# Patient Record
Sex: Female | Born: 1995 | Race: White | Hispanic: No | Marital: Married | Smoking: Current every day smoker
Health system: Southern US, Community
[De-identification: ages and names within clinical notes are randomized; demographics above are authoritative.]

## PROBLEM LIST (undated history)

## (undated) ENCOUNTER — Inpatient Hospital Stay: Payer: Self-pay

## (undated) DIAGNOSIS — N289 Disorder of kidney and ureter, unspecified: Secondary | ICD-10-CM

## (undated) DIAGNOSIS — F32A Depression, unspecified: Secondary | ICD-10-CM

## (undated) DIAGNOSIS — Z720 Tobacco use: Secondary | ICD-10-CM

## (undated) DIAGNOSIS — F419 Anxiety disorder, unspecified: Secondary | ICD-10-CM

## (undated) DIAGNOSIS — N390 Urinary tract infection, site not specified: Secondary | ICD-10-CM

## (undated) DIAGNOSIS — B009 Herpesviral infection, unspecified: Secondary | ICD-10-CM

## (undated) DIAGNOSIS — D649 Anemia, unspecified: Secondary | ICD-10-CM

## (undated) DIAGNOSIS — K589 Irritable bowel syndrome without diarrhea: Secondary | ICD-10-CM

## (undated) DIAGNOSIS — T7421XA Adult sexual abuse, confirmed, initial encounter: Secondary | ICD-10-CM

## (undated) HISTORY — DX: Urinary tract infection, site not specified: N39.0

## (undated) HISTORY — DX: Anemia, unspecified: D64.9

## (undated) HISTORY — DX: Herpesviral infection, unspecified: B00.9

## (undated) HISTORY — PX: WISDOM TOOTH EXTRACTION: SHX21

## (undated) HISTORY — DX: Tobacco use: Z72.0

## (undated) HISTORY — DX: Adult sexual abuse, confirmed, initial encounter: T74.21XA

## (undated) HISTORY — PX: NO PAST SURGERIES: SHX2092

---
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
## 2013-04-21 ENCOUNTER — Emergency Department: Payer: Self-pay | Admitting: Emergency Medicine

## 2013-05-31 ENCOUNTER — Observation Stay: Payer: Self-pay

## 2013-05-31 LAB — URINALYSIS, COMPLETE
Bacteria: NONE SEEN
Bilirubin,UR: NEGATIVE
Glucose,UR: NEGATIVE mg/dL (ref 0–75)
Ketone: NEGATIVE
Nitrite: NEGATIVE
Ph: 5 (ref 4.5–8.0)
Protein: 30
RBC,UR: 7 /HPF (ref 0–5)
Specific Gravity: 1.028 (ref 1.003–1.030)
Squamous Epithelial: 10
WBC UR: 478 /HPF (ref 0–5)

## 2013-05-31 LAB — HCG, QUANTITATIVE, PREGNANCY: Beta Hcg, Quant.: 59013 m[IU]/mL — ABNORMAL HIGH

## 2013-12-11 ENCOUNTER — Inpatient Hospital Stay: Payer: Self-pay | Admitting: Obstetrics and Gynecology

## 2013-12-11 LAB — CBC WITH DIFFERENTIAL/PLATELET
Basophil #: 0 10*3/uL (ref 0.0–0.1)
Basophil %: 0.1 %
Eosinophil #: 0.2 10*3/uL (ref 0.0–0.7)
Eosinophil %: 1.3 %
HCT: 36.1 % (ref 35.0–47.0)
HGB: 12.2 g/dL (ref 12.0–16.0)
Lymphocyte #: 1.8 10*3/uL (ref 1.0–3.6)
Lymphocyte %: 14.8 %
MCH: 31.7 pg (ref 26.0–34.0)
MCHC: 33.8 g/dL (ref 32.0–36.0)
MCV: 94 fL (ref 80–100)
Monocyte #: 0.7 x10 3/mm (ref 0.2–0.9)
Monocyte %: 6.3 %
Neutrophil #: 9.2 10*3/uL — ABNORMAL HIGH (ref 1.4–6.5)
Neutrophil %: 77.5 %
Platelet: 157 10*3/uL (ref 150–440)
RBC: 3.84 10*6/uL (ref 3.80–5.20)
RDW: 13.4 % (ref 11.5–14.5)
WBC: 11.9 10*3/uL — ABNORMAL HIGH (ref 3.6–11.0)

## 2013-12-11 LAB — GC/CHLAMYDIA PROBE AMP

## 2013-12-13 LAB — HEMATOCRIT: HCT: 32.4 % — ABNORMAL LOW (ref 35.0–47.0)

## 2014-09-04 NOTE — L&D Delivery Note (Signed)
Delivery Summary for Nicole Kramer  Labor Events:   Preterm labor:   Rupture date:   Rupture time:   Rupture type: Artificial  Fluid Color: Clear  Induction:   Augmentation:   Complications:   Cervical ripening:          Delivery: SVD; Loose nuchal cord reduced  Episiotomy: None  Lacerations: None  Repair suture:   Repair # of packets:   Blood loss (ml): 500 mL   Information for the patient's newbornBaruch Merl:  Kramer, PendingBaby [[id number]][[id number]]    Delivery 06/24/2015 2:09 PM by  Vaginal, Spontaneous Delivery Sex:  unspecified sex Gestational Age: 2137w5d Delivery Clinician:  Daphine DeutscherMartin A Angie Hogg Living?: Yes        APGARS  One minute Five minutes Ten minutes  Skin color: 1   1      Heart rate: 2   2      Grimace: 2   2      Muscle tone: 2   2      Breathing: 2   2      Totals: 9  9      Presentation/position: Vertex  Right Occiput Anterior Resuscitation:   Cord information: 3 vessels   Disposition of cord blood: No    Blood gases sent? No Complications: None  Placenta: Delivered: 06/24/2015 2:17 PM  Spontaneous   appearance Newborn Measurements: Weight:    Height:    Head circumference:    Chest circumference:    Other providers: Delivery Nurse Transition RN Tammy F Margo AyeHall Tiffany D [place]  Additional  Information:Loose nuchal cord, reduced Forceps:   Vacuum:   Breech:   Observed anomalies

## 2014-10-30 ENCOUNTER — Emergency Department: Payer: Self-pay | Admitting: Student

## 2014-12-20 ENCOUNTER — Emergency Department
Admit: 2014-12-20 | Disposition: A | Discharge status: Home / Self Care | Payer: Self-pay | Admitting: Emergency Medicine

## 2014-12-20 LAB — URINALYSIS, COMPLETE
Bilirubin,UR: NEGATIVE
Blood: NEGATIVE
Glucose,UR: NEGATIVE mg/dL (ref 0–75)
Ketone: NEGATIVE
Leukocyte Esterase: NEGATIVE
Nitrite: NEGATIVE
Ph: 7 (ref 4.5–8.0)
Protein: NEGATIVE
Specific Gravity: 1.005 (ref 1.003–1.030)

## 2014-12-20 LAB — COMPREHENSIVE METABOLIC PANEL
Albumin: 4.4 g/dL
Alkaline Phosphatase: 45 U/L
Anion Gap: 8 (ref 7–16)
BUN: 6 mg/dL
Bilirubin,Total: 0.7 mg/dL
Calcium, Total: 9.1 mg/dL
Chloride: 105 mmol/L
Co2: 25 mmol/L
Creatinine: 0.44 mg/dL
EGFR (African American): >60
EGFR (Non-African Amer.): >60
Glucose: 82 mg/dL
Potassium: 3.4 mmol/L — ABNORMAL LOW
SGOT(AST): 23 U/L
SGPT (ALT): 16 U/L
Sodium: 138 mmol/L
Total Protein: 7.4 g/dL

## 2014-12-20 LAB — CBC WITH DIFFERENTIAL/PLATELET
Basophil #: 0 10*3/uL (ref 0.0–0.1)
Basophil %: 0.3 %
Eosinophil #: 0.2 10*3/uL (ref 0.0–0.7)
Eosinophil %: 3.2 %
HCT: 37.1 % (ref 35.0–47.0)
HGB: 12.5 g/dL (ref 12.0–16.0)
Lymphocyte #: 1.5 10*3/uL (ref 1.0–3.6)
Lymphocyte %: 20 %
MCH: 31.5 pg (ref 26.0–34.0)
MCHC: 33.8 g/dL (ref 32.0–36.0)
MCV: 93 fL (ref 80–100)
Monocyte #: 0.5 x10 3/mm (ref 0.2–0.9)
Monocyte %: 6.6 %
Neutrophil #: 5.1 10*3/uL (ref 1.4–6.5)
Neutrophil %: 69.9 %
Platelet: 181 10*3/uL (ref 150–440)
RBC: 3.98 10*6/uL (ref 3.80–5.20)
RDW: 13.4 % (ref 11.5–14.5)
WBC: 7.3 10*3/uL (ref 3.6–11.0)

## 2014-12-20 LAB — HCG, QUANTITATIVE, PREGNANCY: Beta Hcg, Quant.: 65040 m[IU]/mL — ABNORMAL HIGH

## 2015-01-12 NOTE — H&P (Signed)
L&D Evaluation:  History:   HPI 16 yowf G1P0 at 39.6 weeks, estimated date of confinement 05/30/2012, admitted for IOL with Cytotec/Pitocin.    Patient's Medical History Recurrent UTI    Patient's Surgical History none    Medications Pre Natal Vitamins    Allergies NKDA    Social History none    Family History Non-Contributory   ROS:   ROS All systems were reviewed.  HEENT, CNS, GI, GU, Respiratory, CV, Renal and Musculoskeletal systems were found to be normal.   Exam:   Vital Signs stable    General no apparent distress    Mental Status clear    Heart normal sinus rhythm    Abdomen gravid, non-tender, EFW 8#4    Estimated Fetal Weight Average for gestational age    Fundal Height 38    Back no CVAT    Edema 1+    Pelvic no external lesions, 4/90/-2/AROM - Bloody, IUPC placed    Mebranes Ruptured, Bloody    FHT normal rate with no decels    Ucx regular    Skin dry    Lymph no lymphadenopathy    Other A+/ATB-/NR/RI/VI/HB-/HIV-/GBS-   Impression:   Impression TIUP for IOL   Plan:   Plan spontaneous vaginal delivery; Epidural prn   Electronic Signatures: Mackynzie Woolford, Prentice DockerMartin A (MD)  (Signed 26-Sep-13 08:43)  Authored: L&D Evaluation   Last Updated: 26-Sep-13 08:43 by Aimy Sweeting, Prentice DockerMartin A (MD)
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
## 2015-02-11 ENCOUNTER — Encounter: Payer: Self-pay | Admitting: Obstetrics and Gynecology

## 2015-02-11 ENCOUNTER — Ambulatory Visit (INDEPENDENT_AMBULATORY_CARE_PROVIDER_SITE_OTHER): Payer: Medicaid Other | Admitting: Obstetrics and Gynecology

## 2015-02-11 VITALS — BP 94/61 | HR 103 | Ht 67.0 in | Wt 169.5 lb

## 2015-02-11 DIAGNOSIS — B3731 Acute candidiasis of vulva and vagina: Secondary | ICD-10-CM

## 2015-02-11 DIAGNOSIS — Z36 Encounter for antenatal screening of mother: Secondary | ICD-10-CM

## 2015-02-11 DIAGNOSIS — Z62810 Personal history of physical and sexual abuse in childhood: Secondary | ICD-10-CM

## 2015-02-11 DIAGNOSIS — Z3492 Encounter for supervision of normal pregnancy, unspecified, second trimester: Secondary | ICD-10-CM

## 2015-02-11 DIAGNOSIS — Z87440 Personal history of urinary (tract) infections: Secondary | ICD-10-CM

## 2015-02-11 DIAGNOSIS — Z331 Pregnant state, incidental: Secondary | ICD-10-CM | POA: Diagnosis not present

## 2015-02-11 DIAGNOSIS — Z720 Tobacco use: Secondary | ICD-10-CM

## 2015-02-11 DIAGNOSIS — IMO0002 Reserved for concepts with insufficient information to code with codable children: Secondary | ICD-10-CM

## 2015-02-11 DIAGNOSIS — Z3687 Encounter for antenatal screening for uncertain dates: Secondary | ICD-10-CM

## 2015-02-11 DIAGNOSIS — B373 Candidiasis of vulva and vagina: Secondary | ICD-10-CM

## 2015-02-11 MED ORDER — TERCONAZOLE 0.4 % VA CREA: 1.0000 | TOPICAL_CREAM | Freq: Every day | VAGINAL | Status: DC

## 2015-02-12 ENCOUNTER — Telehealth: Payer: Self-pay

## 2015-02-12 ENCOUNTER — Encounter: Payer: Self-pay | Admitting: Obstetrics and Gynecology

## 2015-02-12 DIAGNOSIS — N390 Urinary tract infection, site not specified: Secondary | ICD-10-CM

## 2015-02-12 DIAGNOSIS — Z87440 Personal history of urinary (tract) infections: Secondary | ICD-10-CM | POA: Insufficient documentation

## 2015-02-12 DIAGNOSIS — B373 Candidiasis of vulva and vagina: Secondary | ICD-10-CM | POA: Insufficient documentation

## 2015-02-12 DIAGNOSIS — B3731 Acute candidiasis of vulva and vagina: Secondary | ICD-10-CM | POA: Insufficient documentation

## 2015-02-12 DIAGNOSIS — F17200 Nicotine dependence, unspecified, uncomplicated: Secondary | ICD-10-CM | POA: Insufficient documentation

## 2015-02-12 DIAGNOSIS — IMO0002 Reserved for concepts with insufficient information to code with codable children: Secondary | ICD-10-CM | POA: Insufficient documentation

## 2015-02-12 LAB — ANTIBODY SCREEN: Antibody Screen: NEGATIVE

## 2015-02-12 LAB — CBC WITH DIFFERENTIAL/PLATELET
Basophils Absolute: 0 10*3/uL (ref 0.0–0.2)
Basos: 0 %
EOS (ABSOLUTE): 0.3 10*3/uL (ref 0.0–0.4)
Eos: 2 %
Hematocrit: 33.3 % — ABNORMAL LOW (ref 34.0–46.6)
Hemoglobin: 11.1 g/dL (ref 11.1–15.9)
Immature Grans (Abs): 0 10*3/uL (ref 0.0–0.1)
Immature Granulocytes: 0 %
Lymphocytes Absolute: 1.8 10*3/uL (ref 0.7–3.1)
Lymphs: 16 %
MCH: 31.9 pg (ref 26.6–33.0)
MCHC: 33.3 g/dL (ref 31.5–35.7)
MCV: 96 fL (ref 79–97)
Monocytes Absolute: 0.5 10*3/uL (ref 0.1–0.9)
Monocytes: 4 %
Neutrophils Absolute: 8.7 10*3/uL — ABNORMAL HIGH (ref 1.4–7.0)
Neutrophils: 78 %
Platelets: 214 10*3/uL (ref 150–379)
RBC: 3.48 x10E6/uL — ABNORMAL LOW (ref 3.77–5.28)
RDW: 13.8 % (ref 12.3–15.4)
WBC: 11.3 10*3/uL — ABNORMAL HIGH (ref 3.4–10.8)

## 2015-02-12 LAB — MICROSCOPIC EXAMINATION
Casts: NONE SEEN /lpf
WBC, UA: >30 /hpf — AB (ref 0–?)

## 2015-02-12 LAB — URINALYSIS, ROUTINE W REFLEX MICROSCOPIC
Bilirubin, UA: NEGATIVE
Glucose, UA: NEGATIVE
Ketones, UA: NEGATIVE
Nitrite, UA: POSITIVE — AB
Specific Gravity, UA: 1.019 (ref 1.005–1.030)
Urobilinogen, Ur: 0.2 mg/dL (ref 0.2–1.0)
pH, UA: 6.5 (ref 5.0–7.5)

## 2015-02-12 LAB — HIV ANTIBODY (ROUTINE TESTING W REFLEX): HIV Screen 4th Generation wRfx: NONREACTIVE

## 2015-02-12 LAB — ABO AND RH: Rh Factor: POSITIVE

## 2015-02-12 LAB — RPR: RPR Ser Ql: NONREACTIVE

## 2015-02-12 LAB — VARICELLA ZOSTER ANTIBODY, IGG: Varicella zoster IgG: 173 index (ref >165)

## 2015-02-12 LAB — RUBELLA SCREEN: Rubella Antibodies, IGG: <0.9 index — ABNORMAL LOW (ref >0.99)

## 2015-02-12 LAB — HEPATITIS B SURFACE ANTIGEN: Hepatitis B Surface Ag: NEGATIVE

## 2015-02-12 MED ORDER — NITROFURANTOIN MONOHYD MACRO 100 MG PO CAPS: 100.0000 mg | ORAL_CAPSULE | Freq: Two times a day (BID) | ORAL | Status: DC
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
## 2015-02-12 NOTE — Telephone Encounter (Signed)
Pt aware. Med erx. 

## 2015-02-12 NOTE — Telephone Encounter (Signed)
-----   Message from Herold Harms, MD sent at 02/12/2015  4:10 PM EDT ----- Please notify - Abnormal Labs UTI based on + nitrites. Please call in Macrobid bid x 7 days

## 2015-02-13 LAB — URINE CULTURE

## 2015-02-14 LAB — OB RESULTS CONSOLE HIV ANTIBODY (ROUTINE TESTING)
HIV: NONREACTIVE
HIV: NONREACTIVE

## 2015-02-14 LAB — PAP IG, CT-NG, RFX HPV ASCU: PAP Smear Comment: 0

## 2015-02-14 LAB — OB RESULTS CONSOLE VARICELLA ZOSTER ANTIBODY, IGG: Varicella: IMMUNE

## 2015-02-14 LAB — OB RESULTS CONSOLE RUBELLA ANTIBODY, IGM: Rubella: NON-IMMUNE/NOT IMMUNE

## 2015-02-14 LAB — OB RESULTS CONSOLE RPR: RPR: NONREACTIVE

## 2015-02-14 LAB — OB RESULTS CONSOLE GC/CHLAMYDIA
Chlamydia: NEGATIVE
Gonorrhea: NEGATIVE

## 2015-02-15 ENCOUNTER — Telehealth: Payer: Self-pay

## 2015-02-15 NOTE — Telephone Encounter (Signed)
-----   Message from Herold Harms, MD sent at 02/14/2015  8:49 PM EDT ----- Please Notify - Labs normal Pap shows Candida. Pt may use OTC Monistat if symptomatic.

## 2015-02-16 NOTE — Telephone Encounter (Signed)
Pt given Terazol at nob visit. Advised to complete meds. Will mail letter to pt.

## 2015-02-17 ENCOUNTER — Other Ambulatory Visit: Payer: Medicaid Other

## 2015-02-23 ENCOUNTER — Other Ambulatory Visit: Payer: Medicaid Other

## 2015-02-24 ENCOUNTER — Other Ambulatory Visit: Payer: Medicaid Other

## 2015-02-24 DIAGNOSIS — Z36 Encounter for antenatal screening of mother: Secondary | ICD-10-CM | POA: Diagnosis not present

## 2015-02-24 DIAGNOSIS — Z3687 Encounter for antenatal screening for uncertain dates: Secondary | ICD-10-CM

## 2015-03-11 ENCOUNTER — Encounter: Payer: Medicaid Other | Admitting: Obstetrics and Gynecology

## 2015-03-14 ENCOUNTER — Observation Stay
Admission: EM | Admit: 2015-03-14 | Discharge: 2015-03-15 | Disposition: A | Discharge status: Home / Self Care | Payer: No Typology Code available for payment source | Attending: Obstetrics and Gynecology | Admitting: Obstetrics and Gynecology

## 2015-03-14 ENCOUNTER — Encounter: Payer: Self-pay | Admitting: Emergency Medicine

## 2015-03-14 DIAGNOSIS — N898 Other specified noninflammatory disorders of vagina: Secondary | ICD-10-CM | POA: Diagnosis present

## 2015-03-14 DIAGNOSIS — O26899 Other specified pregnancy related conditions, unspecified trimester: Secondary | ICD-10-CM | POA: Diagnosis not present

## 2015-03-14 LAB — URINALYSIS COMPLETE WITH MICROSCOPIC (ARMC ONLY)
Bacteria, UA: NONE SEEN
Bilirubin Urine: NEGATIVE
Glucose, UA: NEGATIVE mg/dL
Hgb urine dipstick: NEGATIVE
Ketones, ur: NEGATIVE mg/dL
Nitrite: NEGATIVE
Protein, ur: NEGATIVE mg/dL
RBC / HPF: NONE SEEN RBC/hpf (ref 0–5)
Specific Gravity, Urine: 1.016 (ref 1.005–1.030)
pH: 6 (ref 5.0–8.0)

## 2015-03-14 LAB — CHLAMYDIA/NGC RT PCR (ARMC ONLY)
Chlamydia Tr: NOT DETECTED
N gonorrhoeae: NOT DETECTED

## 2015-03-14 LAB — WET PREP, GENITAL
Clue Cells Wet Prep HPF POC: NONE SEEN
Trich, Wet Prep: NONE SEEN
Yeast Wet Prep HPF POC: NONE SEEN

## 2015-03-14 NOTE — ED Notes (Signed)
Called L&D, stated they would see pt.

## 2015-03-14 NOTE — ED Notes (Addendum)
Pt. States vaginal discharge after using yeast infection cream for two weeks.  Pt. States sores in vaginal area causing odor and itching starting yesterday.  Pt. Stated symptoms started yesterday.  Pt. States she is [redacted] weeks pregnant.

## 2015-03-14 NOTE — OB Triage Note (Signed)
Pt presents to L&D with c/o vaginal redness and swelling since diagnosed with yeast infection 3-4 weeks ago, as well as sores on vagina. Pt has had chronic UTIs and is on daily Macrobid and just finished cream for yeast infection last week but still symptomatic.

## 2015-03-15 ENCOUNTER — Telehealth: Payer: Self-pay | Admitting: Obstetrics and Gynecology

## 2015-03-15 DIAGNOSIS — O26899 Other specified pregnancy related conditions, unspecified trimester: Secondary | ICD-10-CM | POA: Diagnosis not present

## 2015-03-15 NOTE — Discharge Instructions (Signed)
Call provider or return to [facility]place with: ° °1. Regular contractions °2. Leaking of fluid from your vaginal °3. Vaginal bleeding: Bright red or heavy like a period °4. Decreased Fetal movement ° °

## 2015-03-15 NOTE — Telephone Encounter (Signed)
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 If results are back before appt on Wednesday we will call her.

## 2015-03-15 NOTE — Telephone Encounter (Signed)
SORRY DEB FORGOT TO SAY CALL  HER AROUND 12PM SO SHE CAN TALK

## 2015-03-15 NOTE — Telephone Encounter (Signed)
PT WAS SEEN IN ER YESTERDAY FOR BLISTERS/ STD. THEY TOLD HER TO CALL HERE FOR A F/U (WED/ :15PM). SHE WANTS TO KNOW HER RESULTS . ER DID NOT GIVE TO HER.

## 2015-03-17 ENCOUNTER — Encounter: Payer: Self-pay | Admitting: Obstetrics and Gynecology

## 2015-03-17 ENCOUNTER — Ambulatory Visit (INDEPENDENT_AMBULATORY_CARE_PROVIDER_SITE_OTHER): Payer: No Typology Code available for payment source | Admitting: Obstetrics and Gynecology

## 2015-03-17 VITALS — BP 93/56 | HR 121 | Wt 170.9 lb

## 2015-03-17 DIAGNOSIS — N765 Ulceration of vagina: Secondary | ICD-10-CM

## 2015-03-17 DIAGNOSIS — O26893 Other specified pregnancy related conditions, third trimester: Secondary | ICD-10-CM

## 2015-03-17 DIAGNOSIS — Z131 Encounter for screening for diabetes mellitus: Secondary | ICD-10-CM

## 2015-03-17 DIAGNOSIS — R319 Hematuria, unspecified: Secondary | ICD-10-CM

## 2015-03-17 DIAGNOSIS — Z23 Encounter for immunization: Secondary | ICD-10-CM

## 2015-03-17 DIAGNOSIS — Z3483 Encounter for supervision of other normal pregnancy, third trimester: Secondary | ICD-10-CM

## 2015-03-17 DIAGNOSIS — N898 Other specified noninflammatory disorders of vagina: Secondary | ICD-10-CM

## 2015-03-17 DIAGNOSIS — Z3493 Encounter for supervision of normal pregnancy, unspecified, third trimester: Secondary | ICD-10-CM

## 2015-03-17 LAB — POCT URINALYSIS DIPSTICK
Bilirubin, UA: NEGATIVE
Glucose, UA: NEGATIVE
Ketones, UA: NEGATIVE
Nitrite, UA: NEGATIVE
Protein, UA: 1
Spec Grav, UA: 1.025
Urobilinogen, UA: 0.2
pH, UA: 6

## 2015-03-17 MED ORDER — TETANUS-DIPHTH-ACELL PERTUSSIS 5-2.5-18.5 LF-MCG/0.5 IM SUSP
0.5000 mL | Freq: Once | INTRAMUSCULAR | Status: AC
  Administered 2015-03-17: 0.5 mL via INTRAMUSCULAR

## 2015-03-17 MED ORDER — VALACYCLOVIR HCL 500 MG PO TABS: 500.0000 mg | ORAL_TABLET | Freq: Two times a day (BID) | ORAL | Status: DC

## 2015-03-17 MED ORDER — AZITHROMYCIN 1 G PO PACK: 1.0000 g | PACK | Freq: Once | ORAL | Status: DC

## 2015-03-17 MED ORDER — CEFTRIAXONE SODIUM 1 G IJ SOLR
250.0000 mg | INTRAMUSCULAR | Status: DC
  Administered 2015-03-17: 250 mg via INTRAMUSCULAR

## 2015-03-17 MED ORDER — CEFTRIAXONE SODIUM 1 G IJ SOLR: 250.0000 mg | Freq: Once | INTRAMUSCULAR | Status: DC

## 2015-03-17 NOTE — Progress Notes (Signed)
Seen in [facility](02/15/2015)- given macrobid daily. "Spot" on kidney refered to urologist. Seen [facility]- 7/10- std work up all neg- waiting on hsv- Still c/o of canker sores on outside of vagina. Very painful, red, and swollen. Yellow d/c with odor.

## 2015-03-17 NOTE — Telephone Encounter (Signed)
Pt here for an appt today, called [facility] and results obtained. Pt and doctor aware.
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
## 2015-03-18 LAB — HSV 1 AND 2 IGM ABS, INDIRECT
HSV 1 IgM Antibodies: <1:10 {titer}
HSV 2 IgM Antibodies: <1:10 {titer}

## 2015-03-19 ENCOUNTER — Other Ambulatory Visit: Payer: Medicaid Other

## 2015-03-19 DIAGNOSIS — Z131 Encounter for screening for diabetes mellitus: Secondary | ICD-10-CM

## 2015-03-19 LAB — GC/CHLAMYDIA PROBE AMP
Chlamydia trachomatis, NAA: NEGATIVE
Neisseria gonorrhoeae by PCR: NEGATIVE

## 2015-03-19 LAB — URINE CULTURE

## 2015-03-19 LAB — HERPES SIMPLEX VIRUS CULTURE

## 2015-03-20 DIAGNOSIS — O98319 Other infections with a predominantly sexual mode of transmission complicating pregnancy, unspecified trimester: Secondary | ICD-10-CM | POA: Insufficient documentation

## 2015-03-20 LAB — GLUCOSE, 1 HOUR GESTATIONAL: Gestational Diabetes Screen: 60 mg/dL — ABNORMAL LOW (ref 65–139)

## 2015-03-24 ENCOUNTER — Ambulatory Visit: Payer: Medicaid Other

## 2015-03-24 ENCOUNTER — Encounter: Payer: Self-pay | Admitting: Obstetrics and Gynecology

## 2015-03-24 ENCOUNTER — Ambulatory Visit (INDEPENDENT_AMBULATORY_CARE_PROVIDER_SITE_OTHER): Payer: Medicaid Other | Admitting: Obstetrics and Gynecology

## 2015-03-24 VITALS — BP 93/58 | HR 98 | Wt 173.1 lb

## 2015-03-24 DIAGNOSIS — B009 Herpesviral infection, unspecified: Secondary | ICD-10-CM

## 2015-03-24 DIAGNOSIS — O283 Abnormal ultrasonic finding on antenatal screening of mother: Secondary | ICD-10-CM

## 2015-03-24 DIAGNOSIS — O289 Unspecified abnormal findings on antenatal screening of mother: Secondary | ICD-10-CM

## 2015-03-24 DIAGNOSIS — Z3483 Encounter for supervision of other normal pregnancy, third trimester: Secondary | ICD-10-CM | POA: Diagnosis not present

## 2015-03-24 DIAGNOSIS — Z3492 Encounter for supervision of normal pregnancy, unspecified, second trimester: Secondary | ICD-10-CM

## 2015-03-24 DIAGNOSIS — R319 Hematuria, unspecified: Secondary | ICD-10-CM

## 2015-03-24 DIAGNOSIS — Z3493 Encounter for supervision of normal pregnancy, unspecified, third trimester: Secondary | ICD-10-CM

## 2015-03-24 HISTORY — DX: Herpesviral infection, unspecified: B00.9

## 2015-03-24 LAB — POCT URINALYSIS DIPSTICK
Bilirubin, UA: NEGATIVE
Glucose, UA: NEGATIVE
Ketones, UA: NEGATIVE
Nitrite, UA: NEGATIVE
Protein, UA: NEGATIVE
Spec Grav, UA: 1.01
Urobilinogen, UA: 0.2
pH, UA: 6

## 2015-03-24 NOTE — Progress Notes (Signed)
Growth ultrasound demonstrates normal interval growth.  EDC is corrected and there is no size date discrepancy.  EDC is 06/26/2015 based on 5.6 week ultrasound and follow-up anatomy scan.  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

## 2015-03-24 NOTE — Progress Notes (Signed)
Seen at [facility] given - valtrex 1000 bid x 5d - feeling better,slight swelling in vaginal area, still have yellow d/c

## 2015-03-24 NOTE — Patient Instructions (Signed)
1.  Complete course of Valtrex as prescribed today. 2.  We'll need to start Valtrex prophylaxis at [redacted] weeks gestation 3.  Return in 3 weeks for regular OB appointment 4.  Ultrasound in 4 weeks to assess left kidney cyst

## 2015-03-25 LAB — URINE CULTURE: Organism ID, Bacteria: NO GROWTH

## 2015-04-20 ENCOUNTER — Ambulatory Visit (INDEPENDENT_AMBULATORY_CARE_PROVIDER_SITE_OTHER): Payer: Medicaid Other | Admitting: Obstetrics and Gynecology

## 2015-04-20 ENCOUNTER — Encounter: Payer: Self-pay | Admitting: Obstetrics and Gynecology

## 2015-04-20 VITALS — BP 91/56 | HR 106 | Wt 177.6 lb

## 2015-04-20 DIAGNOSIS — Z3493 Encounter for supervision of normal pregnancy, unspecified, third trimester: Secondary | ICD-10-CM

## 2015-04-20 DIAGNOSIS — O283 Abnormal ultrasonic finding on antenatal screening of mother: Secondary | ICD-10-CM | POA: Insufficient documentation

## 2015-04-20 DIAGNOSIS — O289 Unspecified abnormal findings on antenatal screening of mother: Secondary | ICD-10-CM

## 2015-04-20 DIAGNOSIS — R938 Abnormal findings on diagnostic imaging of other specified body structures: Secondary | ICD-10-CM

## 2015-04-20 DIAGNOSIS — R319 Hematuria, unspecified: Secondary | ICD-10-CM

## 2015-04-20 LAB — POCT URINALYSIS DIPSTICK
Bilirubin, UA: NEGATIVE
Glucose, UA: NEGATIVE
Ketones, UA: NEGATIVE
Nitrite, UA: NEGATIVE
Protein, UA: NEGATIVE
Spec Grav, UA: 1.025
Urobilinogen, UA: 0.2
pH, UA: 6

## 2015-04-20 NOTE — Progress Notes (Signed)
NO complaints. Previous ultrasound demonstrated Exophytic growth on left kidney, 7.4 mm; Repeat ultrasound is scheduled for next week to assess this lesion, fetal growth, and fetal presentation.

## 2015-04-22 LAB — URINE CULTURE

## 2015-04-27 ENCOUNTER — Encounter: Payer: Self-pay | Admitting: Obstetrics and Gynecology

## 2015-04-27 ENCOUNTER — Ambulatory Visit: Payer: Medicaid Other | Admitting: Obstetrics and Gynecology

## 2015-04-27 ENCOUNTER — Ambulatory Visit (INDEPENDENT_AMBULATORY_CARE_PROVIDER_SITE_OTHER): Payer: Medicaid Other | Admitting: Obstetrics and Gynecology

## 2015-04-27 VITALS — BP 109/65 | HR 103 | Wt 179.9 lb

## 2015-04-27 DIAGNOSIS — O368133 Decreased fetal movements, third trimester, fetus 3: Secondary | ICD-10-CM | POA: Diagnosis not present

## 2015-04-27 DIAGNOSIS — Z3493 Encounter for supervision of normal pregnancy, unspecified, third trimester: Secondary | ICD-10-CM

## 2015-04-27 LAB — POCT URINALYSIS DIPSTICK
Bilirubin, UA: NEGATIVE
Blood, UA: NEGATIVE
Glucose, UA: NEGATIVE
Ketones, UA: NEGATIVE
Leukocytes, UA: NEGATIVE
Nitrite, UA: NEGATIVE
Protein, UA: NEGATIVE
Spec Grav, UA: 1.015
Urobilinogen, UA: 0.2
pH, UA: 6

## 2015-04-27 NOTE — Progress Notes (Signed)
Ob workin- c/o dizzy,h/a, seeing spots- x 1 day-  Patient complains of decreased fetal movement. NST is Reassuring for 31 week pregnancy; no decelerations.  Physical exam is unremarkable.  Reassurance given.

## 2015-04-27 NOTE — Patient Instructions (Signed)
1.  Reassurance given. 2.  Fetal kick counts twice a day. 3.  Return in 2 weeks for regular OB appointment

## 2015-05-05 ENCOUNTER — Encounter: Payer: Self-pay | Admitting: Obstetrics and Gynecology

## 2015-05-05 ENCOUNTER — Ambulatory Visit: Payer: Medicaid Other

## 2015-05-05 ENCOUNTER — Ambulatory Visit (INDEPENDENT_AMBULATORY_CARE_PROVIDER_SITE_OTHER): Payer: Medicaid Other | Admitting: Obstetrics and Gynecology

## 2015-05-05 VITALS — BP 94/59 | HR 94 | Wt 181.4 lb

## 2015-05-05 DIAGNOSIS — O283 Abnormal ultrasonic finding on antenatal screening of mother: Secondary | ICD-10-CM

## 2015-05-05 DIAGNOSIS — O289 Unspecified abnormal findings on antenatal screening of mother: Secondary | ICD-10-CM

## 2015-05-05 DIAGNOSIS — Z3493 Encounter for supervision of normal pregnancy, unspecified, third trimester: Secondary | ICD-10-CM

## 2015-05-05 DIAGNOSIS — Z3483 Encounter for supervision of other normal pregnancy, third trimester: Secondary | ICD-10-CM | POA: Diagnosis not present

## 2015-05-05 LAB — POCT URINALYSIS DIPSTICK
Bilirubin, UA: NEGATIVE
Glucose, UA: NEGATIVE
Ketones, UA: NEGATIVE
Nitrite, UA: NEGATIVE
Protein, UA: NEGATIVE
Spec Grav, UA: 1.01
Urobilinogen, UA: 0.2
pH, UA: 7

## 2015-05-05 NOTE — Progress Notes (Signed)
No complaints. Follow-up ultrasound today demonstrates A stable cystic lesion in the upper pole of right kidney.  I recommended this be followed up by ultrasonography Postpartum.

## 2015-05-05 NOTE — Patient Instructions (Signed)
1.  Return for OB visit in 2 weeks. 2.  Recommend follow-up ultrasound on baby's kidneys After delivery.

## 2015-05-10 ENCOUNTER — Observation Stay
Admission: EM | Admit: 2015-05-10 | Discharge: 2015-05-10 | Disposition: A | Discharge status: Home / Self Care | Payer: No Typology Code available for payment source | Attending: Obstetrics & Gynecology | Admitting: Obstetrics & Gynecology

## 2015-05-10 DIAGNOSIS — Z3493 Encounter for supervision of normal pregnancy, unspecified, third trimester: Secondary | ICD-10-CM | POA: Diagnosis not present

## 2015-05-10 DIAGNOSIS — Z3A33 33 weeks gestation of pregnancy: Secondary | ICD-10-CM | POA: Insufficient documentation

## 2015-05-10 NOTE — Plan of Care (Signed)
Report to MD/informed MD of pts complaint of leaking fluid since last night and off and on today. Informed that pt has chronic UTI history and is on macrobid.  Pt denies feeling like a UTI.  Pt also states she feels pressure and MD told that RN did SVE and pt is closed internally and has loose outer os.  Cervix approx 40 percent effaced.  MD states to watch pt for two hours and d/c home if no change at that time.  Ellison Carwin RNC

## 2015-05-10 NOTE — Discharge Instructions (Signed)
Pt has appointment next Wednesday one week from now.  Instructed to call [facility] if needed any sooner or any problems arise.  Pt agrees with plan of care. j Pt ready to leave dept in stable condition ambulatory with family and friends.  Ellison Carwin RNC

## 2015-05-19 ENCOUNTER — Ambulatory Visit (INDEPENDENT_AMBULATORY_CARE_PROVIDER_SITE_OTHER): Payer: Medicaid Other | Admitting: Obstetrics and Gynecology

## 2015-05-19 ENCOUNTER — Encounter: Payer: Self-pay | Admitting: Obstetrics and Gynecology

## 2015-05-19 VITALS — BP 94/57 | HR 102 | Wt 183.7 lb

## 2015-05-19 DIAGNOSIS — B009 Herpesviral infection, unspecified: Secondary | ICD-10-CM

## 2015-05-19 DIAGNOSIS — Z3493 Encounter for supervision of normal pregnancy, unspecified, third trimester: Secondary | ICD-10-CM | POA: Diagnosis not present

## 2015-05-19 DIAGNOSIS — R319 Hematuria, unspecified: Secondary | ICD-10-CM

## 2015-05-19 DIAGNOSIS — Z720 Tobacco use: Secondary | ICD-10-CM

## 2015-05-19 LAB — POCT URINALYSIS DIPSTICK
Bilirubin, UA: 1
Glucose, UA: NEGATIVE
Ketones, UA: NEGATIVE
Nitrite, UA: NEGATIVE
Protein, UA: NEGATIVE
Spec Grav, UA: 1.02
Urobilinogen, UA: 0.2
pH, UA: 6.5

## 2015-05-19 MED ORDER — INFLUENZA VAC SPLIT QUAD 0.5 ML IM SUSY
0.5000 mL | PREFILLED_SYRINGE | Freq: Once | INTRAMUSCULAR | Status: AC
  Administered 2015-05-19: 0.5 mL via INTRAMUSCULAR

## 2015-05-19 NOTE — Progress Notes (Signed)
Seen in hospital 9/1- ctx- cervix closed-nst normal neg nitrazine. Flu vaccine today.  At next visit obtain 36 week cultures and start patient on acyclovir prophylaxis for herpes genitalis prevention.  Growth ultrasound is normal; cystic lesion in the kidney is stable; follow-up postpartum with  ultrasound

## 2015-05-21 LAB — URINE CULTURE: Organism ID, Bacteria: NO GROWTH

## 2015-06-03 ENCOUNTER — Encounter: Payer: Self-pay | Admitting: Obstetrics and Gynecology

## 2015-06-03 ENCOUNTER — Ambulatory Visit (INDEPENDENT_AMBULATORY_CARE_PROVIDER_SITE_OTHER): Payer: Medicaid Other | Admitting: Obstetrics and Gynecology

## 2015-06-03 VITALS — BP 90/57 | HR 93 | Wt 183.7 lb

## 2015-06-03 DIAGNOSIS — Z113 Encounter for screening for infections with a predominantly sexual mode of transmission: Secondary | ICD-10-CM

## 2015-06-03 DIAGNOSIS — Z36 Encounter for antenatal screening of mother: Secondary | ICD-10-CM

## 2015-06-03 DIAGNOSIS — B009 Herpesviral infection, unspecified: Secondary | ICD-10-CM

## 2015-06-03 DIAGNOSIS — K5900 Constipation, unspecified: Secondary | ICD-10-CM

## 2015-06-03 DIAGNOSIS — Z3685 Encounter for antenatal screening for Streptococcus B: Secondary | ICD-10-CM

## 2015-06-03 DIAGNOSIS — Z3493 Encounter for supervision of normal pregnancy, unspecified, third trimester: Secondary | ICD-10-CM

## 2015-06-03 DIAGNOSIS — O99613 Diseases of the digestive system complicating pregnancy, third trimester: Secondary | ICD-10-CM

## 2015-06-03 LAB — POCT URINALYSIS DIPSTICK
Bilirubin, UA: NEGATIVE
Glucose, UA: NEGATIVE
Ketones, UA: NEGATIVE
Leukocytes, UA: NEGATIVE
Nitrite, UA: NEGATIVE
Protein, UA: NEGATIVE
Spec Grav, UA: <=1.005
Urobilinogen, UA: 0.2
pH, UA: 6.5

## 2015-06-03 LAB — OB RESULTS CONSOLE GC/CHLAMYDIA
Chlamydia: NEGATIVE
Gonorrhea: NEGATIVE

## 2015-06-03 LAB — OB RESULTS CONSOLE GBS: GBS: NEGATIVE

## 2015-06-03 MED ORDER — ACYCLOVIR 400 MG PO TABS: 400.0000 mg | ORAL_TABLET | Freq: Two times a day (BID) | ORAL | Status: DC

## 2015-06-03 NOTE — Progress Notes (Signed)
36 week cultures today 

## 2015-06-03 NOTE — Progress Notes (Signed)
ROB: Patient doing well, no complaints.  36 week cultures performed today.  Large amount of hard stool in rectal vault.  Patient notes no BM in 1 week, is taking Miralax, stool softeners without relief.  Advised on enema.  Started on Acyclovir for HSV prophylaxis.  RTC in 1 week.

## 2015-06-05 LAB — GC/CHLAMYDIA PROBE AMP
Chlamydia trachomatis, NAA: NEGATIVE
Neisseria gonorrhoeae by PCR: NEGATIVE

## 2015-06-05 LAB — STREP GP B NAA: Strep Gp B NAA: NEGATIVE

## 2015-06-10 ENCOUNTER — Ambulatory Visit (INDEPENDENT_AMBULATORY_CARE_PROVIDER_SITE_OTHER): Payer: Medicaid Other | Admitting: Obstetrics and Gynecology

## 2015-06-10 ENCOUNTER — Encounter: Payer: Self-pay | Admitting: Obstetrics and Gynecology

## 2015-06-10 VITALS — BP 101/64 | HR 103 | Wt 186.0 lb

## 2015-06-10 DIAGNOSIS — Z720 Tobacco use: Secondary | ICD-10-CM

## 2015-06-10 DIAGNOSIS — Z3493 Encounter for supervision of normal pregnancy, unspecified, third trimester: Secondary | ICD-10-CM

## 2015-06-10 DIAGNOSIS — B009 Herpesviral infection, unspecified: Secondary | ICD-10-CM

## 2015-06-10 LAB — POCT URINALYSIS DIPSTICK
Bilirubin, UA: NEGATIVE
Glucose, UA: NEGATIVE
Ketones, UA: NEGATIVE
Nitrite, UA: NEGATIVE
Protein, UA: NEGATIVE
Spec Grav, UA: 1.01
Urobilinogen, UA: 0.2
pH, UA: 7.5

## 2015-06-10 NOTE — Progress Notes (Signed)
BM- yesterday with stool softener. 36 week culture- neg.

## 2015-06-16 ENCOUNTER — Ambulatory Visit (INDEPENDENT_AMBULATORY_CARE_PROVIDER_SITE_OTHER): Payer: Medicaid Other | Admitting: Obstetrics and Gynecology

## 2015-06-16 VITALS — BP 103/59 | HR 98 | Wt 185.0 lb

## 2015-06-16 DIAGNOSIS — B009 Herpesviral infection, unspecified: Secondary | ICD-10-CM

## 2015-06-16 DIAGNOSIS — Z720 Tobacco use: Secondary | ICD-10-CM

## 2015-06-16 DIAGNOSIS — Z3493 Encounter for supervision of normal pregnancy, unspecified, third trimester: Secondary | ICD-10-CM

## 2015-06-16 LAB — POCT URINALYSIS DIPSTICK
Bilirubin, UA: NEGATIVE
Glucose, UA: NEGATIVE
Ketones, UA: NEGATIVE
Nitrite, UA: NEGATIVE
Protein, UA: NEGATIVE
Spec Grav, UA: <=1.005
Urobilinogen, UA: 0.2
pH, UA: 7

## 2015-06-16 NOTE — Progress Notes (Signed)
No complaints. No regular contractions.  Cervix slightly More thinned out.  Labor precautions given.  Will consider Scheduling induction of labor at next visit if desired.

## 2015-06-19 ENCOUNTER — Inpatient Hospital Stay
Admission: EM | Admit: 2015-06-19 | Discharge: 2015-06-19 | Disposition: A | Discharge status: Home / Self Care | Payer: No Typology Code available for payment source | Attending: Obstetrics and Gynecology | Admitting: Obstetrics and Gynecology

## 2015-06-19 DIAGNOSIS — Z3493 Encounter for supervision of normal pregnancy, unspecified, third trimester: Secondary | ICD-10-CM | POA: Insufficient documentation

## 2015-06-23 ENCOUNTER — Ambulatory Visit (INDEPENDENT_AMBULATORY_CARE_PROVIDER_SITE_OTHER): Payer: Medicaid Other | Admitting: Obstetrics and Gynecology

## 2015-06-23 VITALS — BP 98/63 | HR 106 | Wt 185.0 lb

## 2015-06-23 DIAGNOSIS — Z3493 Encounter for supervision of normal pregnancy, unspecified, third trimester: Secondary | ICD-10-CM

## 2015-06-23 LAB — POCT URINALYSIS DIPSTICK
Bilirubin, UA: NEGATIVE
Glucose, UA: NEGATIVE
Ketones, UA: NEGATIVE
Nitrite, UA: NEGATIVE
Protein, UA: NEGATIVE
Spec Grav, UA: 1.01
Urobilinogen, UA: 0.2
pH, UA: 6.5

## 2015-06-23 NOTE — Progress Notes (Signed)
No complaints. Irregular contractions.  Cervical exam is unchanged, but softer.Plan: Pitocin induction of labor in the next 1-2 days.

## 2015-06-24 ENCOUNTER — Inpatient Hospital Stay
Admission: RE | Admit: 2015-06-24 | Discharge: 2015-06-25 | DRG: 774 | Disposition: A | Discharge status: Home / Self Care | Payer: No Typology Code available for payment source | Attending: Obstetrics and Gynecology | Admitting: Obstetrics and Gynecology

## 2015-06-24 ENCOUNTER — Encounter: Payer: Self-pay | Admitting: *Deleted

## 2015-06-24 DIAGNOSIS — Z3490 Encounter for supervision of normal pregnancy, unspecified, unspecified trimester: Secondary | ICD-10-CM

## 2015-06-24 DIAGNOSIS — B009 Herpesviral infection, unspecified: Secondary | ICD-10-CM | POA: Diagnosis present

## 2015-06-24 DIAGNOSIS — Z3483 Encounter for supervision of other normal pregnancy, third trimester: Secondary | ICD-10-CM | POA: Diagnosis present

## 2015-06-24 DIAGNOSIS — Z3493 Encounter for supervision of normal pregnancy, unspecified, third trimester: Secondary | ICD-10-CM | POA: Diagnosis not present

## 2015-06-24 DIAGNOSIS — Z3A39 39 weeks gestation of pregnancy: Secondary | ICD-10-CM

## 2015-06-24 DIAGNOSIS — O9852 Other viral diseases complicating childbirth: Secondary | ICD-10-CM | POA: Diagnosis present

## 2015-06-24 LAB — CBC
HCT: 35 % (ref 35.0–47.0)
Hemoglobin: 11.9 g/dL — ABNORMAL LOW (ref 12.0–16.0)
MCH: 32.7 pg (ref 26.0–34.0)
MCHC: 34.1 g/dL (ref 32.0–36.0)
MCV: 95.8 fL (ref 80.0–100.0)
Platelets: 187 10*3/uL (ref 150–440)
RBC: 3.65 MIL/uL — ABNORMAL LOW (ref 3.80–5.20)
RDW: 13.6 % (ref 11.5–14.5)
WBC: 12.2 10*3/uL — ABNORMAL HIGH (ref 3.6–11.0)

## 2015-06-24 LAB — CHLAMYDIA/NGC RT PCR (ARMC ONLY)
Chlamydia Tr: NOT DETECTED
N gonorrhoeae: NOT DETECTED

## 2015-06-24 LAB — ABO/RH: ABO/RH(D): A POS

## 2015-06-24 LAB — TYPE AND SCREEN
ABO/RH(D): A POS
Antibody Screen: NEGATIVE

## 2015-06-24 MED ORDER — OXYCODONE-ACETAMINOPHEN 5-325 MG PO TABS: 1.0000 | ORAL_TABLET | ORAL | Status: DC | PRN

## 2015-06-24 MED ORDER — OXYTOCIN BOLUS FROM INFUSION: 500.0000 mL | INTRAVENOUS | Status: DC

## 2015-06-24 MED ORDER — DIBUCAINE 1 % RE OINT: 1.0000 "application " | TOPICAL_OINTMENT | RECTAL | Status: DC | PRN

## 2015-06-24 MED ORDER — DOCUSATE SODIUM 100 MG PO CAPS
100.0000 mg | ORAL_CAPSULE | Freq: Two times a day (BID) | ORAL | Status: DC
  Administered 2015-06-24 – 2015-06-25 (×2): 100 mg via ORAL
  Filled 2015-06-24 (×2): qty 1

## 2015-06-24 MED ORDER — MEASLES, MUMPS & RUBELLA VAC ~~LOC~~ INJ
0.5000 mL | INJECTION | Freq: Once | SUBCUTANEOUS | Status: AC
  Administered 2015-06-25: 0.5 mL via SUBCUTANEOUS
  Filled 2015-06-24: qty 0.5

## 2015-06-24 MED ORDER — CITRIC ACID-SODIUM CITRATE 334-500 MG/5ML PO SOLN: 30.0000 mL | ORAL | Status: DC | PRN

## 2015-06-24 MED ORDER — IBUPROFEN 800 MG PO TABS
800.0000 mg | ORAL_TABLET | Freq: Three times a day (TID) | ORAL | Status: DC
  Administered 2015-06-24 – 2015-06-25 (×3): 800 mg via ORAL
  Filled 2015-06-24 (×4): qty 1

## 2015-06-24 MED ORDER — MISOPROSTOL 200 MCG PO TABS
ORAL_TABLET | ORAL | Status: AC
  Filled 2015-06-24: qty 4

## 2015-06-24 MED ORDER — LIDOCAINE HCL (PF) 1 % IJ SOLN
30.0000 mL | INTRAMUSCULAR | Status: DC | PRN
  Filled 2015-06-24: qty 30

## 2015-06-24 MED ORDER — WITCH HAZEL-GLYCERIN EX PADS: 1.0000 "application " | MEDICATED_PAD | CUTANEOUS | Status: DC | PRN

## 2015-06-24 MED ORDER — OXYTOCIN 40 UNITS IN LACTATED RINGERS INFUSION - SIMPLE MED
1.0000 m[IU]/min | INTRAVENOUS | Status: DC
  Administered 2015-06-24: 1 m[IU]/min via INTRAVENOUS
  Filled 2015-06-24: qty 1000

## 2015-06-24 MED ORDER — ACETAMINOPHEN 325 MG PO TABS: 650.0000 mg | ORAL_TABLET | ORAL | Status: DC | PRN

## 2015-06-24 MED ORDER — ONDANSETRON HCL 4 MG PO TABS: 4.0000 mg | ORAL_TABLET | ORAL | Status: DC | PRN

## 2015-06-24 MED ORDER — TERBUTALINE SULFATE 1 MG/ML IJ SOLN: 0.2500 mg | Freq: Once | INTRAMUSCULAR | Status: DC | PRN

## 2015-06-24 MED ORDER — OXYTOCIN 10 UNIT/ML IJ SOLN
INTRAMUSCULAR | Status: AC
  Filled 2015-06-24: qty 2

## 2015-06-24 MED ORDER — PRENATAL MULTIVITAMIN CH
1.0000 | ORAL_TABLET | Freq: Every day | ORAL | Status: DC
  Administered 2015-06-25: 1 via ORAL
  Filled 2015-06-24: qty 1

## 2015-06-24 MED ORDER — ONDANSETRON HCL 4 MG/2ML IJ SOLN
4.0000 mg | INTRAMUSCULAR | Status: DC | PRN
  Administered 2015-06-24: 4 mg via INTRAVENOUS

## 2015-06-24 MED ORDER — SIMETHICONE 80 MG PO CHEW: 80.0000 mg | CHEWABLE_TABLET | ORAL | Status: DC | PRN

## 2015-06-24 MED ORDER — OXYCODONE-ACETAMINOPHEN 5-325 MG PO TABS
2.0000 | ORAL_TABLET | ORAL | Status: DC | PRN
  Administered 2015-06-24: 2 via ORAL
  Filled 2015-06-24: qty 2

## 2015-06-24 MED ORDER — SODIUM CHLORIDE 0.9 % IV SOLN
1.0000 g | INTRAVENOUS | Status: DC
  Filled 2015-06-24 (×4): qty 1000

## 2015-06-24 MED ORDER — FENTANYL CITRATE (PF) 100 MCG/2ML IJ SOLN
50.0000 ug | INTRAMUSCULAR | Status: DC | PRN
  Administered 2015-06-24: 100 ug via INTRAVENOUS
  Filled 2015-06-24: qty 2

## 2015-06-24 MED ORDER — LACTATED RINGERS IV SOLN
INTRAVENOUS | Status: DC
  Administered 2015-06-24: 07:00:00 via INTRAVENOUS

## 2015-06-24 MED ORDER — FERROUS SULFATE 325 (65 FE) MG PO TABS
325.0000 mg | ORAL_TABLET | Freq: Two times a day (BID) | ORAL | Status: DC
  Administered 2015-06-25 (×2): 325 mg via ORAL
  Filled 2015-06-24 (×2): qty 1

## 2015-06-24 MED ORDER — TETANUS-DIPHTH-ACELL PERTUSSIS 5-2.5-18.5 LF-MCG/0.5 IM SUSP: 0.5000 mL | INTRAMUSCULAR | Status: DC | PRN

## 2015-06-24 MED ORDER — OXYTOCIN 40 UNITS IN LACTATED RINGERS INFUSION - SIMPLE MED: 62.5000 mL/h | INTRAVENOUS | Status: DC

## 2015-06-24 MED ORDER — LACTATED RINGERS IV SOLN: 500.0000 mL | INTRAVENOUS | Status: DC | PRN

## 2015-06-24 MED ORDER — AMMONIA AROMATIC IN INHA
RESPIRATORY_TRACT | Status: DC
Start: 2015-06-24 — End: 2015-06-24
  Filled 2015-06-24: qty 10

## 2015-06-24 MED ORDER — DIPHENHYDRAMINE HCL 25 MG PO CAPS: 25.0000 mg | ORAL_CAPSULE | Freq: Four times a day (QID) | ORAL | Status: DC | PRN

## 2015-06-24 MED ORDER — LIDOCAINE HCL (PF) 1 % IJ SOLN
INTRAMUSCULAR | Status: AC
  Filled 2015-06-24: qty 30

## 2015-06-24 MED ORDER — ONDANSETRON HCL 4 MG/2ML IJ SOLN
4.0000 mg | Freq: Four times a day (QID) | INTRAMUSCULAR | Status: DC | PRN
  Filled 2015-06-24: qty 2
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
## 2015-06-25 DIAGNOSIS — Z3493 Encounter for supervision of normal pregnancy, unspecified, third trimester: Secondary | ICD-10-CM

## 2015-06-25 LAB — CBC
HCT: 32.2 % — ABNORMAL LOW (ref 35.0–47.0)
Hemoglobin: 10.8 g/dL — ABNORMAL LOW (ref 12.0–16.0)
MCH: 32.1 pg (ref 26.0–34.0)
MCHC: 33.6 g/dL (ref 32.0–36.0)
MCV: 95.8 fL (ref 80.0–100.0)
Platelets: 161 10*3/uL (ref 150–440)
RBC: 3.36 MIL/uL — ABNORMAL LOW (ref 3.80–5.20)
RDW: 13.8 % (ref 11.5–14.5)
WBC: 14.2 10*3/uL — ABNORMAL HIGH (ref 3.6–11.0)

## 2015-06-25 LAB — RPR: RPR Ser Ql: NONREACTIVE

## 2015-06-25 MED ORDER — MEDROXYPROGESTERONE ACETATE 150 MG/ML IM SUSP
150.0000 mg | Freq: Once | INTRAMUSCULAR | Status: AC
  Administered 2015-06-25: 150 mg via INTRAMUSCULAR
  Filled 2015-06-25: qty 1

## 2015-06-25 MED ORDER — IBUPROFEN 800 MG PO TABS: 800.0000 mg | ORAL_TABLET | Freq: Three times a day (TID) | ORAL | Status: DC

## 2015-06-25 MED ORDER — MEDROXYPROGESTERONE ACETATE 150 MG/ML IM SUSP: 150.0000 mg | INTRAMUSCULAR | Status: DC

## 2015-06-25 NOTE — Progress Notes (Signed)
Patient discharged home with infant. Discharge instructions, prescriptions and follow up appointment given to and reviewed with patient. Patient verbalized understanding. Escorted out with infant by auxillary. 

## 2015-06-25 NOTE — Discharge Summary (Signed)
Obstetric Discharge Summary Reason for Admission: induction of labor Prenatal Procedures: NST and ultrasound Intrapartum Procedures: spontaneous vaginal delivery Postpartum Procedures: none Complications-Operative and Postpartum: none HEMOGLOBIN  Date Value Ref Range Status  06/25/2015 10.8* 12.0 - 16.0 g/dL Final   HGB  Date Value Ref Range Status  12/20/2014 12.5 12.0-16.0 g/dL Final   HCT  Date Value Ref Range Status  06/25/2015 32.2* 35.0 - 47.0 % Final  12/20/2014 37.1 35.0-47.0 % Final   HEMATOCRIT  Date Value Ref Range Status  02/11/2015 33.3* 34.0 - 46.6 % Final    Physical Exam:  General: alert and cooperative Lochia: appropriate Uterine Fundus: firm Incision: na DVT Evaluation: No evidence of DVT seen on physical exam. Negative Homan's sign.  Discharge Diagnoses: Term Pregnancy-delivered  Discharge Information: Date: 06/25/2015 Activity: pelvic rest Diet: routine Medications: PNV, Ibuprofen and Tylenol Condition: stable Instructions: refer to practice specific booklet Discharge to: home Follow-up Information    Follow up with Herold HarmsMartin A Tiki Tucciarone, MD. Go in 6 weeks.   Specialties:  Obstetrics and Gynecology, Radiology   Why:  6 week Post Partum Check   Contact information:   [address] [place] [address] [phone]       Newborn Data: Live born female  Birth Weight: 7 lb 10.8 oz (3480 g) APGAR: 9, 9  Home with mother  Bottlefeeding. Depo Provera given - contraception  Prentice DockerMartin A Trany Chernick 06/25/2015, 3:42 PM

## 2015-06-25 NOTE — Progress Notes (Addendum)
Post Partum Day 1 Subjective: voiding, tolerating PO and + flatus  C/o abdominal pain, some vaginal bleeding that she noted as clots yesterday but spotting today No BM but receiving Colace Baby boy Landon  Objective: Blood pressure 106/63, pulse 82, temperature 97.7 F (36.5 C), temperature source Oral, resp. rate 18, last menstrual period 08/04/2014, SpO2 100 %, unknown if currently breastfeeding. - not breastfeeding  Physical Exam:  General: alert, cooperative and no distress  CV: S1 S2, RRR Pulm: CTABL Lochia: appropriate Uterine Fundus: firm Incision: NA DVT Evaluation: No evidence of DVT seen on physical exam. Negative Homan's sign.   Recent Labs  06/24/15 0631 06/25/15 0620  HGB 11.9* 10.8*  HCT 35.0 32.2*    Assessment/Plan: Plan for discharge tomorrow  Exclusively formula feeding   LOS: 1 day   Doreene NestKlaus, Elena, PA-S 06/25/2015, 9:15 AM   Herold HarmsMartin A Jovaun Levene, MD
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
## 2015-07-01 NOTE — Progress Notes (Signed)
NST INTERPRETATION:  Indications: Contractions  Mode: External Baseline Rate (A): 125 bpm Variability: Moderate Accelerations: 15 x 15 Decelerations: None     Contraction Frequency (min): irregular   Impression:  1.reactive NST 2.  Contractions. 3.  Pregnancy, third trimester   Plan:  1.  Labor precautions. 2.  Maintain regular OB appointment    Herold HarmsMartin A Defrancesco, MD

## 2015-07-06 ENCOUNTER — Encounter: Payer: Self-pay | Admitting: Obstetrics and Gynecology

## 2015-07-06 ENCOUNTER — Ambulatory Visit (INDEPENDENT_AMBULATORY_CARE_PROVIDER_SITE_OTHER): Payer: No Typology Code available for payment source | Admitting: Obstetrics and Gynecology

## 2015-07-06 VITALS — BP 110/73 | HR 88 | Ht 71.0 in | Wt 169.8 lb

## 2015-07-06 DIAGNOSIS — O9089 Other complications of the puerperium, not elsewhere classified: Secondary | ICD-10-CM

## 2015-07-06 DIAGNOSIS — R102 Pelvic and perineal pain: Secondary | ICD-10-CM

## 2015-07-06 DIAGNOSIS — R52 Pain, unspecified: Secondary | ICD-10-CM

## 2015-07-06 LAB — POCT URINALYSIS DIPSTICK
Bilirubin, UA: NEGATIVE
Glucose, UA: NEGATIVE
Ketones, UA: NEGATIVE
Nitrite, UA: NEGATIVE
Protein, UA: NEGATIVE
Spec Grav, UA: 1.015
Urobilinogen, UA: 0.2
pH, UA: 6
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
## 2015-07-06 NOTE — Patient Instructions (Signed)
1. POCT urinalysis dipstick and urine culture to rule out cystitis/pyelonephritis 2. Counseled to monitor for fevers, chills, vomiting that may indicate worsening condition/infection 3. Motrin and Tylenol for pain control 4. Follow up in 4 weeks for regular post-partum visit

## 2015-07-09 LAB — URINE CULTURE: Organism ID, Bacteria: NO GROWTH

## 2015-08-03 ENCOUNTER — Ambulatory Visit (INDEPENDENT_AMBULATORY_CARE_PROVIDER_SITE_OTHER): Payer: No Typology Code available for payment source | Admitting: Obstetrics and Gynecology

## 2015-08-03 ENCOUNTER — Encounter: Payer: Self-pay | Admitting: Obstetrics and Gynecology

## 2015-08-03 DIAGNOSIS — F53 Postpartum depression: Secondary | ICD-10-CM | POA: Insufficient documentation

## 2015-08-03 DIAGNOSIS — O99345 Other mental disorders complicating the puerperium: Secondary | ICD-10-CM | POA: Insufficient documentation

## 2015-08-03 MED ORDER — SERTRALINE HCL 50 MG PO TABS: 50.0000 mg | ORAL_TABLET | Freq: Every day | ORAL | Status: DC

## 2015-08-03 NOTE — Progress Notes (Signed)
Patient ID: Nicole Ruddyshley D Harlan, female   DOB: [birth year], 19 y.o.   MRN: [id number] 6 week pp S/p svd 06/24/2015-  Female -7.10 Bottle feeding phq- 7 Contraception- depo given at delivery- 06/25/2015  Subjective:     Nicole Kramer is a 19 y.o. female who presents for a postpartum visit. She is 6 weeks postpartum following a spontaneous vaginal delivery. I have fully reviewed the prenatal and intrapartum course. The delivery was at 40 gestational weeks. Outcome: spontaneous vaginal delivery. Anesthesia: epidural. Postpartum course has been Uncomplicated. Baby's course has been Uncomplicated. Baby is feeding by bottle - Enfamil Pregestimil and .Marland Kitchen. Bleeding thin lochia. Bowel function is normal. Bladder function is normal. Patient is sexually active. Contraception method is Depo-Provera injections. Postpartum depression screening: positive.  The following portions of the patient's history were reviewed and updated as appropriate: allergies, current medications, past family history, past medical history, past social history, past surgical history and problem list.   Review of Systems Pertinent items are noted in HPI.   Objective:    BP 107/65 mmHg  Pulse 74  Ht 5\' 11"  (1.803 m)  Wt 172 lb 3.2 oz (78.109 kg)  BMI 24.03 kg/m2  Breastfeeding? No  General:  alert and cooperative   Breasts:  negative and inclusion cyst, left nipple  Lungs: clear to auscultation bilaterally  Heart:  regular rate and rhythm  Abdomen: soft, non-tender; bowel sounds normal; no masses,  no organomegaly   Vulva:  normal  Vagina: normal vagina  Cervix:  no cervical motion tenderness and no lesions  Corpus: normal size, contour, position, consistency, mobility, non-tender  Adnexa:  normal adnexa  Rectal Exam: Not performed.        Assessment:     6 week postpartum exam. Pap smear not done at today's visit.  Postpartum depression. Contraception: Depo-Provera Plan:    1. Contraception: Depo-Provera injections;  Return in 6 weeks for next injection 2. Begin Zoloft 50 mg day; return in 6 weeks 3.  Return in 6 months for annual exam  Herold HarmsMartin A Tamy Accardo, MD  Note: This dictation was prepared with Dragon dictation along with smaller phrase technology. Any transcriptional errors that result from this process are unintentional.

## 2015-08-03 NOTE — Patient Instructions (Signed)
1.  Resume all activities without restriction. 2.  Begin Zoloft 50 mg a day; For the first week, cut  the tablets in half and take one half tablet a day. 3.  Return in 6 weeks for next Depo-Provera shot and follow up on postpartum blues

## 2015-09-14 ENCOUNTER — Ambulatory Visit: Payer: No Typology Code available for payment source | Admitting: Obstetrics and Gynecology

## 2015-11-25 ENCOUNTER — Ambulatory Visit
Admission: EM | Admit: 2015-11-25 | Discharge: 2015-11-25 | Disposition: A | Discharge status: Home / Self Care | Payer: Managed Care, Other (non HMO) | Attending: Family Medicine | Admitting: Family Medicine

## 2015-11-25 ENCOUNTER — Encounter: Payer: Self-pay | Admitting: Emergency Medicine

## 2015-11-25 DIAGNOSIS — T162XXA Foreign body in left ear, initial encounter: Secondary | ICD-10-CM | POA: Diagnosis not present
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
## 2015-12-26 ENCOUNTER — Emergency Department
Admission: EM | Admit: 2015-12-26 | Discharge: 2015-12-27 | Disposition: A | Discharge status: Other Acute Inpatient Hospital | Payer: Managed Care, Other (non HMO) | Attending: Emergency Medicine | Admitting: Emergency Medicine

## 2015-12-26 ENCOUNTER — Encounter: Payer: Self-pay | Admitting: Emergency Medicine

## 2015-12-26 DIAGNOSIS — F17210 Nicotine dependence, cigarettes, uncomplicated: Secondary | ICD-10-CM | POA: Diagnosis not present

## 2015-12-26 DIAGNOSIS — T424X2A Poisoning by benzodiazepines, intentional self-harm, initial encounter: Secondary | ICD-10-CM | POA: Diagnosis not present

## 2015-12-26 DIAGNOSIS — Z915 Personal history of self-harm: Secondary | ICD-10-CM | POA: Diagnosis not present

## 2015-12-26 DIAGNOSIS — F322 Major depressive disorder, single episode, severe without psychotic features: Secondary | ICD-10-CM | POA: Diagnosis not present

## 2015-12-26 DIAGNOSIS — T43222A Poisoning by selective serotonin reuptake inhibitors, intentional self-harm, initial encounter: Secondary | ICD-10-CM | POA: Diagnosis not present

## 2015-12-26 DIAGNOSIS — R45851 Suicidal ideations: Secondary | ICD-10-CM

## 2015-12-26 DIAGNOSIS — F332 Major depressive disorder, recurrent severe without psychotic features: Secondary | ICD-10-CM

## 2015-12-26 DIAGNOSIS — Z79899 Other long term (current) drug therapy: Secondary | ICD-10-CM | POA: Diagnosis not present

## 2015-12-26 DIAGNOSIS — N390 Urinary tract infection, site not specified: Secondary | ICD-10-CM | POA: Diagnosis present

## 2015-12-26 DIAGNOSIS — T50902A Poisoning by unspecified drugs, medicaments and biological substances, intentional self-harm, initial encounter: Secondary | ICD-10-CM

## 2015-12-26 DIAGNOSIS — F4325 Adjustment disorder with mixed disturbance of emotions and conduct: Secondary | ICD-10-CM

## 2015-12-26 DIAGNOSIS — F17200 Nicotine dependence, unspecified, uncomplicated: Secondary | ICD-10-CM | POA: Diagnosis present

## 2015-12-26 DIAGNOSIS — T7491XA Unspecified adult maltreatment, confirmed, initial encounter: Secondary | ICD-10-CM

## 2015-12-26 LAB — COMPREHENSIVE METABOLIC PANEL
ALT: 21 U/L (ref 14–54)
AST: 28 U/L (ref 15–41)
Albumin: 5.5 g/dL — ABNORMAL HIGH (ref 3.5–5.0)
Alkaline Phosphatase: 60 U/L (ref 38–126)
Anion gap: 10 (ref 5–15)
BUN: 8 mg/dL (ref 6–20)
CO2: 24 mmol/L (ref 22–32)
Calcium: 10.1 mg/dL (ref 8.9–10.3)
Chloride: 107 mmol/L (ref 101–111)
Creatinine, Ser: 0.71 mg/dL (ref 0.44–1.00)
GFR calc Af Amer: >60 mL/min (ref >60)
GFR calc non Af Amer: >60 mL/min (ref >60)
Glucose, Bld: 98 mg/dL (ref 65–99)
Potassium: 3.4 mmol/L — ABNORMAL LOW (ref 3.5–5.1)
Sodium: 141 mmol/L (ref 135–145)
Total Bilirubin: 0.8 mg/dL (ref 0.3–1.2)
Total Protein: 8.5 g/dL — ABNORMAL HIGH (ref 6.5–8.1)

## 2015-12-26 LAB — URINE DRUG SCREEN, QUALITATIVE (ARMC ONLY)
Amphetamines, Ur Screen: NOT DETECTED
Barbiturates, Ur Screen: NOT DETECTED
Benzodiazepine, Ur Scrn: NOT DETECTED
Cannabinoid 50 Ng, Ur ~~LOC~~: NOT DETECTED
Cocaine Metabolite,Ur ~~LOC~~: NOT DETECTED
MDMA (Ecstasy)Ur Screen: NOT DETECTED
Methadone Scn, Ur: NOT DETECTED
Opiate, Ur Screen: POSITIVE — AB
Phencyclidine (PCP) Ur S: NOT DETECTED
Tricyclic, Ur Screen: NOT DETECTED

## 2015-12-26 LAB — CBC WITH DIFFERENTIAL/PLATELET
Basophils Absolute: 0 10*3/uL (ref 0–0.1)
Basophils Relative: 0 %
Eosinophils Absolute: 0.3 10*3/uL (ref 0–0.7)
Eosinophils Relative: 3 %
HCT: 40.6 % (ref 35.0–47.0)
Hemoglobin: 13.7 g/dL (ref 12.0–16.0)
Lymphocytes Relative: 25 %
Lymphs Abs: 2.9 10*3/uL (ref 1.0–3.6)
MCH: 30.9 pg (ref 26.0–34.0)
MCHC: 33.8 g/dL (ref 32.0–36.0)
MCV: 91.2 fL (ref 80.0–100.0)
Monocytes Absolute: 0.5 10*3/uL (ref 0.2–0.9)
Monocytes Relative: 5 %
Neutro Abs: 7.9 10*3/uL — ABNORMAL HIGH (ref 1.4–6.5)
Neutrophils Relative %: 67 %
Platelets: 203 10*3/uL (ref 150–440)
RBC: 4.45 MIL/uL (ref 3.80–5.20)
RDW: 13.9 % (ref 11.5–14.5)
WBC: 11.6 10*3/uL — ABNORMAL HIGH (ref 3.6–11.0)

## 2015-12-26 LAB — URINALYSIS COMPLETE WITH MICROSCOPIC (ARMC ONLY)
Bilirubin Urine: NEGATIVE
Glucose, UA: NEGATIVE mg/dL
Ketones, ur: NEGATIVE mg/dL
Nitrite: NEGATIVE
Protein, ur: NEGATIVE mg/dL
Specific Gravity, Urine: 1.013 (ref 1.005–1.030)
pH: 6 (ref 5.0–8.0)

## 2015-12-26 LAB — SALICYLATE LEVEL: Salicylate Lvl: <4 mg/dL (ref 2.8–30.0)

## 2015-12-26 LAB — POCT PREGNANCY, URINE: Preg Test, Ur: NEGATIVE

## 2015-12-26 LAB — LIPASE, BLOOD: Lipase: 23 U/L (ref 11–51)

## 2015-12-26 LAB — PREGNANCY, URINE: Preg Test, Ur: NEGATIVE

## 2015-12-26 LAB — ETHANOL: Alcohol, Ethyl (B): <5 mg/dL (ref <5)

## 2015-12-26 LAB — ACETAMINOPHEN LEVEL: Acetaminophen (Tylenol), Serum: 15 ug/mL (ref 10–30)

## 2015-12-26 MED ORDER — LORAZEPAM 1 MG PO TABS
1.0000 mg | ORAL_TABLET | Freq: Once | ORAL | Status: AC
  Administered 2015-12-26: 1 mg via ORAL

## 2015-12-26 MED ORDER — LORAZEPAM 1 MG PO TABS
ORAL_TABLET | ORAL | Status: AC
  Administered 2015-12-26: 1 mg via ORAL
  Filled 2015-12-26: qty 1
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
## 2015-12-26 NOTE — ED Notes (Signed)
Sitting with patient 
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
## 2015-12-27 ENCOUNTER — Encounter: Payer: Self-pay | Admitting: Psychiatry

## 2015-12-27 ENCOUNTER — Inpatient Hospital Stay
Admission: EM | Admit: 2015-12-27 | Discharge: 2015-12-29 | DRG: 885 | Disposition: A | Discharge status: Home / Self Care | Payer: Managed Care, Other (non HMO) | Source: Intra-hospital | Attending: Psychiatry | Admitting: Psychiatry

## 2015-12-27 DIAGNOSIS — Z9119 Patient's noncompliance with other medical treatment and regimen: Secondary | ICD-10-CM | POA: Diagnosis not present

## 2015-12-27 DIAGNOSIS — F4325 Adjustment disorder with mixed disturbance of emotions and conduct: Secondary | ICD-10-CM | POA: Diagnosis present

## 2015-12-27 DIAGNOSIS — Z888 Allergy status to other drugs, medicaments and biological substances status: Secondary | ICD-10-CM | POA: Diagnosis not present

## 2015-12-27 DIAGNOSIS — Z801 Family history of malignant neoplasm of trachea, bronchus and lung: Secondary | ICD-10-CM

## 2015-12-27 DIAGNOSIS — F17210 Nicotine dependence, cigarettes, uncomplicated: Secondary | ICD-10-CM | POA: Diagnosis present

## 2015-12-27 DIAGNOSIS — Z87440 Personal history of urinary (tract) infections: Secondary | ICD-10-CM | POA: Diagnosis not present

## 2015-12-27 DIAGNOSIS — T424X2A Poisoning by benzodiazepines, intentional self-harm, initial encounter: Secondary | ICD-10-CM | POA: Diagnosis present

## 2015-12-27 DIAGNOSIS — F322 Major depressive disorder, single episode, severe without psychotic features: Principal | ICD-10-CM | POA: Diagnosis present

## 2015-12-27 DIAGNOSIS — F332 Major depressive disorder, recurrent severe without psychotic features: Secondary | ICD-10-CM | POA: Diagnosis not present

## 2015-12-27 DIAGNOSIS — F1120 Opioid dependence, uncomplicated: Secondary | ICD-10-CM | POA: Diagnosis present

## 2015-12-27 DIAGNOSIS — N390 Urinary tract infection, site not specified: Secondary | ICD-10-CM | POA: Diagnosis present

## 2015-12-27 DIAGNOSIS — G4700 Insomnia, unspecified: Secondary | ICD-10-CM | POA: Diagnosis present

## 2015-12-27 DIAGNOSIS — Z62811 Personal history of psychological abuse in childhood: Secondary | ICD-10-CM | POA: Diagnosis present

## 2015-12-27 DIAGNOSIS — R45851 Suicidal ideations: Secondary | ICD-10-CM

## 2015-12-27 DIAGNOSIS — T7491XA Unspecified adult maltreatment, confirmed, initial encounter: Secondary | ICD-10-CM | POA: Diagnosis present

## 2015-12-27 DIAGNOSIS — Z62810 Personal history of physical and sexual abuse in childhood: Secondary | ICD-10-CM | POA: Diagnosis present

## 2015-12-27 DIAGNOSIS — F17200 Nicotine dependence, unspecified, uncomplicated: Secondary | ICD-10-CM | POA: Diagnosis present

## 2015-12-27 DIAGNOSIS — Z833 Family history of diabetes mellitus: Secondary | ICD-10-CM | POA: Diagnosis not present

## 2015-12-27 MED ORDER — NICOTINE 14 MG/24HR TD PT24
MEDICATED_PATCH | TRANSDERMAL | Status: AC
  Filled 2015-12-27: qty 1

## 2015-12-27 MED ORDER — SERTRALINE HCL 50 MG PO TABS: 50.0000 mg | ORAL_TABLET | Freq: Every day | ORAL | Status: DC

## 2015-12-27 MED ORDER — NICOTINE 21 MG/24HR TD PT24
21.0000 mg | MEDICATED_PATCH | Freq: Every day | TRANSDERMAL | Status: DC
  Administered 2015-12-28 – 2015-12-29 (×2): 21 mg via TRANSDERMAL
  Filled 2015-12-27 (×2): qty 1

## 2015-12-27 MED ORDER — BUPRENORPHINE HCL 8 MG SL SUBL
8.0000 mg | SUBLINGUAL_TABLET | Freq: Every day | SUBLINGUAL | Status: AC
  Administered 2015-12-27 – 2015-12-29 (×3): 8 mg via SUBLINGUAL
  Filled 2015-12-27 (×3): qty 1

## 2015-12-27 MED ORDER — TRAZODONE HCL 100 MG PO TABS
100.0000 mg | ORAL_TABLET | Freq: Every day | ORAL | Status: DC
  Administered 2015-12-27 – 2015-12-28 (×2): 100 mg via ORAL
  Filled 2015-12-27 (×2): qty 1

## 2015-12-27 MED ORDER — FOSFOMYCIN TROMETHAMINE 3 G PO PACK
3.0000 g | PACK | Freq: Once | ORAL | Status: AC
  Administered 2015-12-27: 3 g via ORAL
  Filled 2015-12-27: qty 3

## 2015-12-27 MED ORDER — MAGNESIUM HYDROXIDE 400 MG/5ML PO SUSP: 30.0000 mL | Freq: Every day | ORAL | Status: DC | PRN

## 2015-12-27 MED ORDER — NICOTINE 14 MG/24HR TD PT24
14.0000 mg | MEDICATED_PATCH | Freq: Once | TRANSDERMAL | Status: DC
  Administered 2015-12-27: 14 mg via TRANSDERMAL

## 2015-12-27 MED ORDER — ALUM & MAG HYDROXIDE-SIMETH 200-200-20 MG/5ML PO SUSP: 30.0000 mL | ORAL | Status: DC | PRN

## 2015-12-27 MED ORDER — ACETAMINOPHEN 325 MG PO TABS: 650.0000 mg | ORAL_TABLET | Freq: Four times a day (QID) | ORAL | Status: DC | PRN
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
## 2015-12-27 NOTE — ED Notes (Signed)
Report was received Maralyn SagoSarah, RN; Pt. Verbalizes no complaints or distress;  S.I with an intentional overdose;Marland Kitchen. Denies havingHi. Continue to monitor with 15 min. Monitoring.

## 2015-12-27 NOTE — Progress Notes (Signed)
Pt being reviewed for possible [facility]. H&P and Assessment [facility] for the charge nurse to review and provide bed assignment.     12/27/2015 Cheryl FlashNicole Divina Neale, MS, NCC, LPCA Therapeutic Triage Specialist
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
## 2015-12-27 NOTE — ED Provider Notes (Signed)
-----------------------------------------   6:41 AM on 12/27/2015 -----------------------------------------   Blood pressure 104/88, pulse 96, temperature 98.6 F (37 C), temperature source Oral, resp. rate 20, height 5\' 6"  (1.676 m), weight 81.647 kg, SpO2 100 %, not currently breastfeeding.  The patient had no acute events since last update.  Calm and cooperative at this time.  Disposition is pending per Psychiatry/Behavioral Medicine team recommendations due to her presentation with suicidal ideation and intentional overdose.     Loleta Roseory Giomar Gusler, MD 12/27/15 [phone]

## 2015-12-27 NOTE — ED Notes (Signed)
Pt denies pain, SI/HI and AVH. Pt very tearful, wanting to use the phone to speak with her kids. RN told pt when phone hours begin. Pt accepting. Pt denied any other needs at this time. No distress noted. Maintained on 15 minute checks and observation by security camera for safety.

## 2015-12-27 NOTE — Progress Notes (Addendum)
[facility] by Dr. Toni Amendlapacs.  Attending Physician will be Dr. Jennet MaduroPucilowska.   Patient has been assigned to room 319B, [facility] Charge Nurse [place].   Intake Paper Work has been signed and placed on patient chart.  ER staff is aware of the admission [facility]( [facility] Sect.; ER MD; Amy Patient's Nurse & Byrd HesselbachMaria Patient Access).   12/27/2015 Cheryl FlashNicole Toy Samarin, MS, NCC, LPCA Therapeutic Triage Specialist

## 2015-12-27 NOTE — [facility] Group Notes (Signed)
[facility] Group Notes:  (Nursing/MHT/Case Management/Adjunct)  Date:  12/27/2015  Time:  9:37 PM  Type of Therapy:  Group Therapy  Participation Level:  Active  Participation Quality:  Appropriate  Affect:  Appropriate  Cognitive:  Appropriate  Insight:  Appropriate  Engagement in Group:  Engaged  Modes of Intervention:  Discussion  Summary of Progress/Problems:  Nicole EkJanice Kramer Nicole Kramer 12/27/2015, 9:37 PM
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
## 2015-12-27 NOTE — ED Notes (Signed)

## 2015-12-27 NOTE — Progress Notes (Signed)
Patient tearful reporting that she has been abusing Vicodin and Percocet.  Patient states that she didn't want anyone to know because she was afraid it would prolong her stay here.

## 2015-12-27 NOTE — ED Notes (Signed)
Pt speaking with social worker.

## 2015-12-27 NOTE — ED Notes (Signed)
Pt making a phone call. Pt is tearful and upset about being admitted to BMU. No distress noted. Maintained on 15 minute checks and observation by security camera for safety.

## 2015-12-28 DIAGNOSIS — F322 Major depressive disorder, single episode, severe without psychotic features: Principal | ICD-10-CM

## 2015-12-28 MED ORDER — VENLAFAXINE HCL ER 75 MG PO CP24
150.0000 mg | ORAL_CAPSULE | Freq: Every day | ORAL | Status: DC
  Administered 2015-12-28 – 2015-12-29 (×2): 150 mg via ORAL
  Filled 2015-12-28 (×2): qty 2

## 2015-12-28 MED ORDER — NITROFURANTOIN MONOHYD MACRO 100 MG PO CAPS
100.0000 mg | ORAL_CAPSULE | Freq: Two times a day (BID) | ORAL | Status: DC
  Administered 2015-12-28 – 2015-12-29 (×3): 100 mg via ORAL
  Filled 2015-12-28 (×3): qty 1

## 2015-12-28 NOTE — Plan of Care (Signed)
Problem: Alteration in mood Goal: LTG-Pt's behavior demonstrates decreased signs of depression (Patient's behavior demonstrates decreased signs of depression to the point the patient is safe to return home and continue treatment in an outpatient setting)  Outcome: Not Progressing Affect flat, tearful.

## 2015-12-28 NOTE — Progress Notes (Signed)
Recreation Therapy Notes  Date: 04.25.17 Time: 2:00 pm Location: Craft Room  Group Topic: Goal Setting  Goal Area(s) Addresses:  Patient will write at least one goal. Patient will write at least one obstacle.  Behavioral Response: Attentive, Interactive  Intervention: Recovery Goal Chart  Activity: Patients were instructed to make a Recovery Goal Chart including goals towards their recovery, obstacles, the date they started working on their goal, and the date they achieved their goal.  Education: LRT educated patients on healthy ways to celebrate reaching their goals.  Education Outcome: Acknowledges education/In group clarification offered  Clinical Observations/Feedback: Patient completed activity by writing goals and obstacles. Patient contributed to group discussion by stating ways she can keep herself focused on her goals.  [name],Keilynn Marano M, LRT/CTRS 12/28/2015 3:32 PM

## 2015-12-28 NOTE — Progress Notes (Signed)
D: Patient denies SI/HI/AVH.  Patient affect is anxious and her mood is depressed.  Patient did attend evening group. Patient visible on the milieu. Patient was complaining of having symptoms of withdrawal from abuse of Vicodin and Percocet.   A: Support and encouragement offered. Scheduled medications given to pt. MD ordered Subutex for management of withdrawal.  Q 15 min checks continued for patient safety. R: Patient receptive. Patient remains safe on the unit.
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
## 2015-12-28 NOTE — [facility] Group Notes (Signed)
[facility] Group Notes:  (Nursing/MHT/Case Management/Adjunct)  Date:  12/28/2015  Time:  4:04 PM  Type of Therapy:  Psychoeducational Skills  Participation Level:  Active  Participation Quality:  Appropriate and Attentive  Affect:  Appropriate and Tearful  Cognitive:  Alert and Appropriate  Insight:  Appropriate and Good  Engagement in Group:  Engaged  Modes of Intervention:  Discussion, Education and Support  Summary of Progress/Problems:  Lynelle SmokeCara Travis Hope Holst 12/28/2015, 4:04 PM
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
## 2015-12-29 MED ORDER — VENLAFAXINE HCL ER 150 MG PO CP24: 150.0000 mg | ORAL_CAPSULE | Freq: Every day | ORAL | Status: DC

## 2015-12-29 MED ORDER — TRAZODONE HCL 100 MG PO TABS: 100.0000 mg | ORAL_TABLET | Freq: Every day | ORAL | Status: DC

## 2015-12-29 NOTE — Tx Team (Signed)
Interdisciplinary Treatment Plan Update (Adult)  Date:  12/29/2015 Time Reviewed:  12:35 PM  Progress in Treatment: Attending groups: Yes. Participating in groups:  Yes. Taking medication as prescribed:  Yes. Tolerating medication:  Yes. Family/Significant othe contact made:  Yes, individual(s) contacted:  patient's mother Patient understands diagnosis:  Yes. Discussing patient identified problems/goals with staff:  Yes. Medical problems stabilized or resolved:  Yes. Denies suicidal/homicidal ideation: Yes. Issues/concerns per patient self-inventory:  Yes. Other:  New problem(s) identified: No, Describe:  none reported  Discharge Plan or Barriers: Patient will stabilize on meds and discharge home with family and follow up outpatient mental health services in [place].   Reason for Continuation of Hospitalization: Depression Medication stabilization Suicidal ideation Withdrawal symptoms  Comments:  Estimated length of stay: 0 days, will discharge today  New goal(s):  Review of initial/current patient goals per problem list:   1.  Goal(s): Participate in aftercare plan   Met:  Yes  Target date: by discharge  As evidenced by: patient will participate in aftercare plan AEB aftercare provider and housing plan identified at discharge 12/29/15: Patient will discharge home with family and has outpatient provider in [place], [place]. Goal Met.   2.  Goal (s): Decrease depression   Met:  Yes  Target date: by discharge  As evidenced by: patient demonstrates decreased symptoms of depression and reports a Depression rating of 3 or less 12/29/15: Patient denies SI and is stable for discharge per MD.   3.  Goal(s):  Patient will demonstrate decreased signs of withdrawal due to substance abuse    Met:  Yes  Target date: by discharge  As evidenced by: Patient will produce a CIWA/COWS score of 0, have stable vitals signs, and no symptoms of withdrawal 12/29/15: Patient is  stable for discharge per MD.   Attendees: Patient:  Nicole Kramer 4/26/201712:35 PM  Physician:  Orson Slick, MD 4/26/201712:35 PM  Nursing:   Carolynn Sayers, RN 4/26/201712:35 PM  Other:  Carmell Austria, LCSW 4/26/201712:35 PM  Other:   4/26/201712:35 PM  Other:   4/26/201712:35 PM  Other:  4/26/201712:35 PM  Other:  4/26/201712:35 PM  Other:  4/26/201712:35 PM  Other:  4/26/201712:35 PM  Other:  4/26/201712:35 PM  Other:  4/26/201712:35 PM  Other:   4/26/201712:35 PM   Scribe for Treatment Team:   Keene Breath, MSW, LCSW  12/29/2015, 12:35 PM

## 2015-12-29 NOTE — Progress Notes (Signed)
Patient discharged at this time to mother's truck for transportation home. Patient sleepy prior to discharge. Patient speaks with MD and has questions rt Subutex. MD visits with patient. Safety maintained.

## 2015-12-29 NOTE — Progress Notes (Signed)
D: Pt denies SI/HI/AVH. Pt is pleasant and cooperative. Pt's  affect is flat and sad but brighten upon approach, she appears less anxious and she is interacting with peers and staff appropriately.  A: Pt was offered support and encouragement. Pt was given scheduled medications. Pt was encouraged to attend groups. Q 15 minute checks were done for safety.  R:Pt attend groups and interacts well with peers and staff. Pt is taking medication.Pt receptive to treatment and safety maintained on unit.
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
## 2015-12-29 NOTE — [facility] Group Notes (Signed)
[facility] LCSW Group Therapy  12/29/2015 2:49 PM  Type of Therapy:  Group Therapy  Participation Level:  Active  Participation Quality:  Resistant  Affect:  Flat  Cognitive:  Appropriate  Insight:  Limited  Engagement in Therapy:  Improving  Modes of Intervention:  Discussion, Socialization and Support  Summary of Progress/Problems:Pt participated in group discussion around Emotion Regulation and how to use coping skills to change intense uncomfortable emotions, to less intense more manageable feelings that allow for better decision making.  Pt shared with the group some current stressors and some of the problems she anticipates facing once she is discharged from the hospital and how she might handle these.  Glennon MacLaws, Viyan Rosamond P, MSW, LCSW 12/29/2015, 2:49 PM

## 2015-12-29 NOTE — Progress Notes (Signed)
Patient to discharge at this time. Verbalized understanding rt recommended discharge plan of care. Acknowledges all belongings have been returned. Safety maintained.

## 2015-12-29 NOTE — Plan of Care (Signed)
Problem: Alteration in mood Goal: LTG-Patient reports reduction in suicidal thoughts (Patient reports reduction in suicidal thoughts and is able to verbalize a safety plan for whenever patient is feeling suicidal)  Outcome: Progressing Patient denies SI.      

## 2015-12-29 NOTE — Progress Notes (Signed)
Recreation Therapy Notes  INPATIENT RECREATION TR PLAN  Patient Details Name: AHRIANNA SIGLIN MRN: [id number] DOB: Mar 19, 1996 Today's Date: 12/29/2015  Rec Therapy Plan Is patient appropriate for Therapeutic Recreation?: Yes Treatment times per week: At least once a week TR Treatment/Interventions: 1:1 session, Group participation (Comment) (Appropriate participation in daily recreational therapy tx)  Discharge Criteria Pt will be discharged from therapy if:: Treatment goals are met, Discharged Treatment plan/goals/alternatives discussed and agreed upon by:: Patient/family  Discharge Summary Short term goals set: See Care Plan Short term goals met: Complete Progress toward goals comments: One-to-one attended Which groups?: Goal setting One-to-one attended: Self-esteem, coping skills Reason goals not met: N/A Therapeutic equipment acquired: None Reason patient discharged from therapy: Discharge from hospital Pt/family agrees with progress & goals achieved: Yes Date patient discharged from therapy: 12/29/15   Leonette Monarch, LRT/CTRS 12/29/2015, 2:34 PM

## 2015-12-29 NOTE — Progress Notes (Signed)
  [facility] :  Will you be returning to the same living situation after discharge:  Yes,  home with family At discharge, do you have transportation home?: Yes,  patient's mother will pick up Do you have the ability to pay for your medications: Yes,  patient has insurance  Release of information consent forms completed and in the chart;  Patient's signature needed at discharge.  Patient to Follow up at: Follow-up Information    [facility] Go on [facility]/14/2016.   Why:  For follow-up care appt with Dr. Fleet Contrasebecca Taylor on Friday 01/14/16 at 10:30am. Arrive at 10:15. Please call 24 hours in advance to reschedule.   Contact information:   [address] [place], [place] Ph [phone] Fax [phone]       Next level of care provider has access to [facility] Link:no  Safety Planning and Suicide Prevention discussed: Yes,  SPE discussed with patient and Barkley Brunsammy Pallo (mother) [phone]   Have you used any form of tobacco in the last 30 days? (Cigarettes, Smokeless Tobacco, Cigars, and/or Pipes): Yes  Has patient been referred to the Quitline?: Patient refused referral  Patient has been referred for addiction treatment: Yes  [name], Eviana Sibilia T, MSW, LCSW 12/29/2015, 12:40 PM [phone]
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
## 2015-12-29 NOTE — Progress Notes (Signed)
Patient with appropriate affect, cooperative behavior with meals, meds and plan of care. No SI/HI at this time. Therapy groups encouraged. Patient to discharge today when discharge plan and transportation in place. Safety maintained.

## 2015-12-29 NOTE — Plan of Care (Signed)
Problem: [facility] Participation in Recreation Therapeutic Interventions Goal: STG-Patient will demonstrate improved self esteem by identif STG: Self-Esteem - Within 3 treatment sessions, patient will verbalize at least 5 positive affirmation statements in one treatment session to increase self-esteem post d/c.  Outcome: Completed/Met Date Met:  12/29/15 Treatment Session 1; Completed 1 out of 1: At approximately 12:30 pm, LRT met with patient in patient room. Patient verbalized 5 positive affirmation statements. Patient reported it felt "alright". LRT encouraged patient to continue saying positive affirmation statements.  Leonette Monarch, LRT/CTRS 04.26.17 2:32 pm Goal: STG-Patient will identify at least five coping skills for ** STG: Coping Skills - Within 3 treatment sessions, patient will verbalize at least 5 coping skills for substance abuse in one treatment session to decrease substance abuse post d/c.  Outcome: Completed/Met Date Met:  12/29/15 Treatment Session 1; Completed 1 out of 1: At approximately 12:30 pm, LRT met with patient in patient room. Patient verbalized 5 coping skills for substance abuse. LRT educated patient in leisure and why it is important to implement into her schedule. LRT educated and provided patient with blank schedules to help him plan his day and try to avoid using substances. LRT educated patient on healthy support systems.  Leonette Monarch, LRT/CTRS 04.26.17 2:34 pm

## 2015-12-29 NOTE — [facility] Suicide Risk Assessment (Signed)
[facility] INPATIENT:  Family/Significant Other Suicide Prevention Education  Suicide Prevention Education:  Education Completed; [name] (mother) [phone] has been identified by the patient as the family member/significant other with whom the patient will be residing, and identified as the person(s) who will aid the patient in the event of a mental health crisis (suicidal ideations/suicide attempt).  With written consent from the patient, the family member/significant other has been provided the following suicide prevention education, prior to the and/or following the discharge of the patient.  The suicide prevention education provided includes the following:  Suicide risk factors  Suicide prevention and interventions  National Suicide Hotline telephone number  [facility] assessment telephone number  [facility] Emergency Assistance 911  [facility] and/or Residential Mobile Crisis Unit telephone number  Request made of family/significant other to:  Remove weapons (e.g., guns, rifles, knives), all items previously/currently identified as safety concern.    Remove drugs/medications (over-the-counter, prescriptions, illicit drugs), all items previously/currently identified as a safety concern.  The family member/significant other verbalizes understanding of the suicide prevention education information provided.  The family member/significant other agrees to remove the items of safety concern listed above.  [name], [name], MSW, LCSW 12/29/2015, 12:34 PM

## 2015-12-30 LAB — URINE CULTURE
Culture: 1000 — AB
Special Requests: NORMAL
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
## 2016-01-19 NOTE — Progress Notes (Signed)
Patient ID: Nicole Kramer, female   DOB: 02/23/1996, 20 y.o.   MRN: [id number]  CSW entered pt's chart to create letter for employer. CSW provided pt with requested letter.   Daisy FloroCandace L Yanis Juma MSW, LCSWA  01/19/2016 3:56 PM

## 2016-04-26 ENCOUNTER — Ambulatory Visit
Admission: EM | Admit: 2016-04-26 | Discharge: 2016-04-26 | Disposition: A | Discharge status: Home / Self Care | Payer: Managed Care, Other (non HMO) | Attending: Family Medicine | Admitting: Family Medicine

## 2016-04-26 ENCOUNTER — Encounter: Payer: Self-pay | Admitting: Emergency Medicine

## 2016-04-26 DIAGNOSIS — N390 Urinary tract infection, site not specified: Secondary | ICD-10-CM | POA: Diagnosis not present

## 2016-04-26 HISTORY — DX: Disorder of kidney and ureter, unspecified: N28.9

## 2016-04-26 LAB — URINALYSIS COMPLETE WITH MICROSCOPIC (ARMC ONLY)
Glucose, UA: NEGATIVE mg/dL
Hgb urine dipstick: NEGATIVE
Nitrite: NEGATIVE
Protein, ur: 100 mg/dL — AB
Specific Gravity, Urine: 1.025 (ref 1.005–1.030)
pH: 6 (ref 5.0–8.0)

## 2016-04-26 MED ORDER — HYDROCODONE-ACETAMINOPHEN 5-325 MG PO TABS: ORAL_TABLET | ORAL | 0 refills | Status: DC

## 2016-04-26 MED ORDER — CIPROFLOXACIN HCL 500 MG PO TABS: 500.0000 mg | ORAL_TABLET | Freq: Two times a day (BID) | ORAL | 0 refills | Status: DC
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
## 2016-04-26 NOTE — ED Triage Notes (Signed)
Patient c/o burning when urinating and lower back pain for the past 2 days.

## 2016-08-17 ENCOUNTER — Ambulatory Visit
Admission: EM | Admit: 2016-08-17 | Discharge: 2016-08-17 | Disposition: A | Discharge status: Home / Self Care | Payer: Managed Care, Other (non HMO) | Attending: Family Medicine | Admitting: Family Medicine

## 2016-08-17 DIAGNOSIS — N730 Acute parametritis and pelvic cellulitis: Secondary | ICD-10-CM

## 2016-08-17 DIAGNOSIS — B373 Candidiasis of vulva and vagina: Secondary | ICD-10-CM

## 2016-08-17 DIAGNOSIS — R1030 Lower abdominal pain, unspecified: Secondary | ICD-10-CM | POA: Diagnosis not present

## 2016-08-17 DIAGNOSIS — B3731 Acute candidiasis of vulva and vagina: Secondary | ICD-10-CM

## 2016-08-17 LAB — URINALYSIS, COMPLETE (UACMP) WITH MICROSCOPIC
Bacteria, UA: NONE SEEN
Bilirubin Urine: NEGATIVE
Glucose, UA: NEGATIVE mg/dL
Hgb urine dipstick: NEGATIVE
Ketones, ur: NEGATIVE mg/dL
Leukocytes, UA: NEGATIVE
Nitrite: NEGATIVE
Protein, ur: NEGATIVE mg/dL
RBC / HPF: NONE SEEN RBC/hpf (ref 0–5)
Specific Gravity, Urine: 1.02 (ref 1.005–1.030)
pH: 6 (ref 5.0–8.0)

## 2016-08-17 LAB — WET PREP, GENITAL
Clue Cells Wet Prep HPF POC: NONE SEEN
Sperm: NONE SEEN
Trich, Wet Prep: NONE SEEN

## 2016-08-17 LAB — PREGNANCY, URINE: Preg Test, Ur: NEGATIVE

## 2016-08-17 MED ORDER — HYDROCODONE-ACETAMINOPHEN 5-325 MG PO TABS: 1.0000 | ORAL_TABLET | Freq: Three times a day (TID) | ORAL | 0 refills | Status: DC | PRN

## 2016-08-17 MED ORDER — METRONIDAZOLE 500 MG PO TABS: 500.0000 mg | ORAL_TABLET | Freq: Two times a day (BID) | ORAL | 0 refills | Status: DC

## 2016-08-17 MED ORDER — FLUCONAZOLE 150 MG PO TABS: 150.0000 mg | ORAL_TABLET | Freq: Once | ORAL | 0 refills | Status: AC

## 2016-08-17 MED ORDER — LEVOFLOXACIN 500 MG PO TABS: 500.0000 mg | ORAL_TABLET | Freq: Every day | ORAL | 0 refills | Status: DC

## 2016-08-17 NOTE — ED Triage Notes (Signed)
Patient complains of lower abdominal pain. Patient states that the pain is excruiating when she lays down and in the morning when she wakes up. Patient states that she has been having this x 3 days.
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
## 2016-08-18 LAB — CHLAMYDIA/NGC RT PCR (ARMC ONLY)
Chlamydia Tr: NOT DETECTED
N gonorrhoeae: NOT DETECTED

## 2016-08-19 LAB — HIV ANTIBODY (ROUTINE TESTING W REFLEX): HIV Screen 4th Generation wRfx: NONREACTIVE

## 2016-08-19 LAB — URINE CULTURE
Culture: NO GROWTH
Special Requests: NORMAL

## 2016-08-19 LAB — RPR: RPR Ser Ql: NONREACTIVE

## 2016-09-21 ENCOUNTER — Emergency Department
Admission: EM | Admit: 2016-09-21 | Discharge: 2016-09-21 | Disposition: A | Discharge status: Home / Self Care | Payer: Managed Care, Other (non HMO) | Attending: Emergency Medicine | Admitting: Emergency Medicine

## 2016-09-21 ENCOUNTER — Encounter: Payer: Self-pay | Admitting: Emergency Medicine

## 2016-09-21 ENCOUNTER — Emergency Department: Payer: Managed Care, Other (non HMO)

## 2016-09-21 DIAGNOSIS — N390 Urinary tract infection, site not specified: Secondary | ICD-10-CM | POA: Diagnosis not present

## 2016-09-21 DIAGNOSIS — F17210 Nicotine dependence, cigarettes, uncomplicated: Secondary | ICD-10-CM | POA: Insufficient documentation

## 2016-09-21 DIAGNOSIS — Z79899 Other long term (current) drug therapy: Secondary | ICD-10-CM | POA: Diagnosis not present

## 2016-09-21 DIAGNOSIS — R1030 Lower abdominal pain, unspecified: Secondary | ICD-10-CM | POA: Diagnosis present

## 2016-09-21 DIAGNOSIS — R109 Unspecified abdominal pain: Secondary | ICD-10-CM

## 2016-09-21 LAB — CBC
HCT: 37.1 % (ref 35.0–47.0)
Hemoglobin: 12.8 g/dL (ref 12.0–16.0)
MCH: 31.4 pg (ref 26.0–34.0)
MCHC: 34.5 g/dL (ref 32.0–36.0)
MCV: 91.1 fL (ref 80.0–100.0)
Platelets: 154 10*3/uL (ref 150–440)
RBC: 4.07 MIL/uL (ref 3.80–5.20)
RDW: 13.7 % (ref 11.5–14.5)
WBC: 6.5 10*3/uL (ref 3.6–11.0)

## 2016-09-21 LAB — BASIC METABOLIC PANEL
Anion gap: 5 (ref 5–15)
BUN: 11 mg/dL (ref 6–20)
CO2: 28 mmol/L (ref 22–32)
Calcium: 9.4 mg/dL (ref 8.9–10.3)
Chloride: 105 mmol/L (ref 101–111)
Creatinine, Ser: 0.71 mg/dL (ref 0.44–1.00)
GFR calc Af Amer: >60 mL/min (ref >60)
GFR calc non Af Amer: >60 mL/min (ref >60)
Glucose, Bld: 92 mg/dL (ref 65–99)
Potassium: 3.7 mmol/L (ref 3.5–5.1)
Sodium: 138 mmol/L (ref 135–145)

## 2016-09-21 LAB — URINALYSIS, COMPLETE (UACMP) WITH MICROSCOPIC
Bacteria, UA: NONE SEEN
Bilirubin Urine: NEGATIVE
Glucose, UA: NEGATIVE mg/dL
Hgb urine dipstick: NEGATIVE
Ketones, ur: NEGATIVE mg/dL
Nitrite: NEGATIVE
Protein, ur: 100 mg/dL — AB
Specific Gravity, Urine: 1.025 (ref 1.005–1.030)
pH: 6 (ref 5.0–8.0)

## 2016-09-21 LAB — POCT PREGNANCY, URINE: Preg Test, Ur: NEGATIVE

## 2016-09-21 MED ORDER — NITROFURANTOIN MONOHYD MACRO 100 MG PO CAPS: 100.0000 mg | ORAL_CAPSULE | Freq: Two times a day (BID) | ORAL | 0 refills | Status: AC

## 2016-09-21 NOTE — ED Notes (Signed)
Pt c/o right flank pain for the last month. Pt stating that it has not gone away after having antibiotics in the past from UC . Pt denying any n/v/d . Pt stating that she had a fever of 102 on Wednesday. Pt stating that she did take some Tylenol.
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
## 2016-09-21 NOTE — ED Triage Notes (Signed)
Pt c/o fever up to 102, lower mid abdominal pain and right flank pain. Has had increased urination. Also has had PID but did not FU

## 2016-09-23 LAB — URINE CULTURE: Culture: NO GROWTH

## 2017-09-22 ENCOUNTER — Emergency Department
Admission: EM | Admit: 2017-09-22 | Discharge: 2017-09-22 | Disposition: A | Discharge status: Home / Self Care | Payer: Managed Care, Other (non HMO)

## 2017-09-24 ENCOUNTER — Other Ambulatory Visit
Admit: 2017-09-24 | Discharge: 2017-09-24 | Disposition: A | Discharge status: Home / Self Care | Payer: Managed Care, Other (non HMO) | Attending: Family Medicine | Admitting: Family Medicine

## 2017-10-07 ENCOUNTER — Emergency Department
Admission: EM | Admit: 2017-10-07 | Discharge: 2017-10-07 | Disposition: A | Discharge status: Home / Self Care | Payer: Managed Care, Other (non HMO) | Attending: Emergency Medicine | Admitting: Emergency Medicine

## 2017-10-07 ENCOUNTER — Other Ambulatory Visit: Payer: Self-pay

## 2017-10-07 DIAGNOSIS — Y929 Unspecified place or not applicable: Secondary | ICD-10-CM | POA: Diagnosis not present

## 2017-10-07 DIAGNOSIS — X58XXXA Exposure to other specified factors, initial encounter: Secondary | ICD-10-CM | POA: Diagnosis not present

## 2017-10-07 DIAGNOSIS — Y939 Activity, unspecified: Secondary | ICD-10-CM | POA: Insufficient documentation

## 2017-10-07 DIAGNOSIS — F17210 Nicotine dependence, cigarettes, uncomplicated: Secondary | ICD-10-CM | POA: Insufficient documentation

## 2017-10-07 DIAGNOSIS — L089 Local infection of the skin and subcutaneous tissue, unspecified: Secondary | ICD-10-CM

## 2017-10-07 DIAGNOSIS — S61209A Unspecified open wound of unspecified finger without damage to nail, initial encounter: Secondary | ICD-10-CM | POA: Diagnosis present

## 2017-10-07 DIAGNOSIS — Z79899 Other long term (current) drug therapy: Secondary | ICD-10-CM | POA: Insufficient documentation

## 2017-10-07 DIAGNOSIS — Y999 Unspecified external cause status: Secondary | ICD-10-CM | POA: Insufficient documentation

## 2017-10-07 MED ORDER — MUPIROCIN CALCIUM 2 % EX CREA: TOPICAL_CREAM | CUTANEOUS | 0 refills | Status: DC

## 2017-10-07 MED ORDER — CLINDAMYCIN HCL 300 MG PO CAPS: 300.0000 mg | ORAL_CAPSULE | Freq: Four times a day (QID) | ORAL | 0 refills | Status: DC

## 2017-10-07 MED ORDER — CLINDAMYCIN PHOSPHATE 300 MG/2ML IJ SOLN
600.0000 mg | Freq: Once | INTRAMUSCULAR | Status: AC
  Administered 2017-10-07: 600 mg via INTRAMUSCULAR
  Filled 2017-10-07: qty 4
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
## 2017-10-07 NOTE — ED Triage Notes (Signed)
Pt states that she started with a blister on her left 3rd finger a week ago, pt states that she has hit it multiple times and thinks that now it is infected

## 2017-10-30 ENCOUNTER — Telehealth (HOSPITAL_COMMUNITY): Payer: Self-pay | Admitting: Psychology

## 2017-10-30 ENCOUNTER — Ambulatory Visit (HOSPITAL_COMMUNITY): Payer: Self-pay | Admitting: Psychology

## 2018-01-18 IMAGING — CT CT RENAL STONE PROTOCOL
3 of 4 series · 8 of 46 positions shown, 15 images · non-contrast
Comparison: None.

CLINICAL DATA: Fever and mid abdominal pain and right flank pain

EXAM:
CT ABDOMEN AND PELVIS WITHOUT CONTRAST
TECHNIQUE: Multidetector CT imaging of the abdomen and pelvis was performed
following the standard protocol without IV contrast.

[Series 4: lung bases · axial · 0.71mm/px · z∈[-168,-98]mm · 4 of 24 slices shown, 9 images]
[im 5/24  soft-tissue]
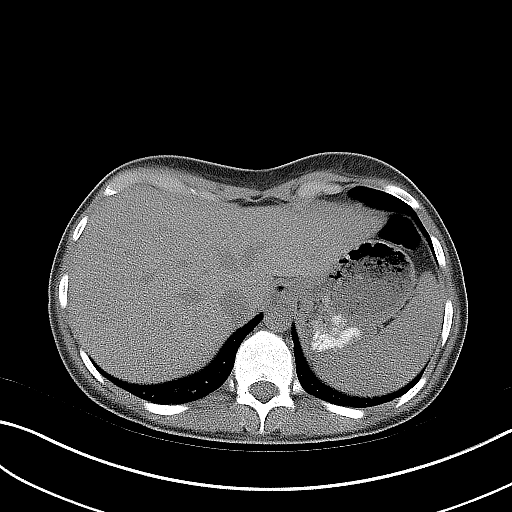
[im 5/24  lung]
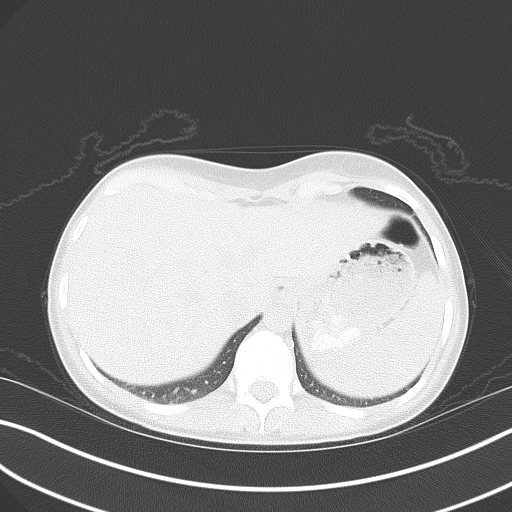
[im 5/24  bone]
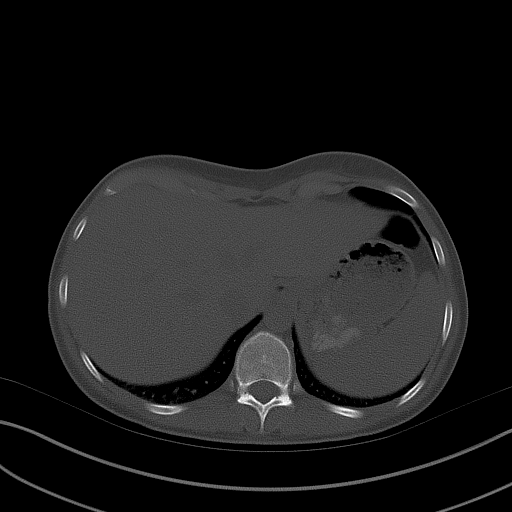
[im 10/24  soft-tissue]
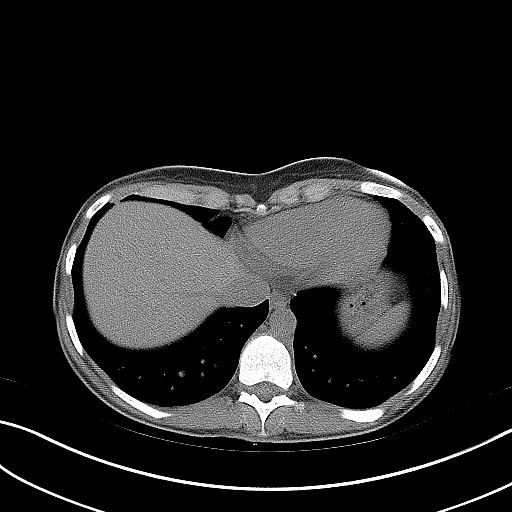
[im 10/24  lung]
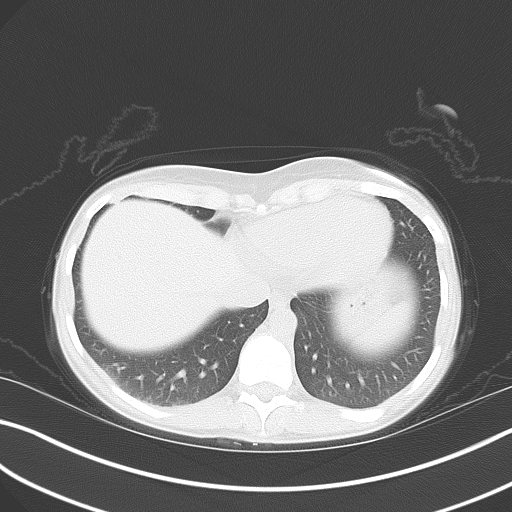
[im 14/24  soft-tissue]
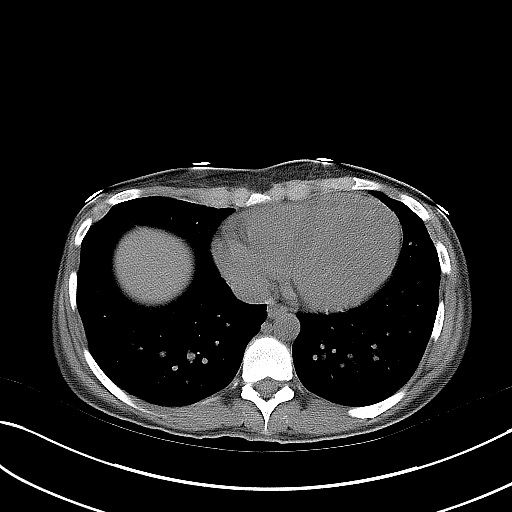
[im 14/24  lung]
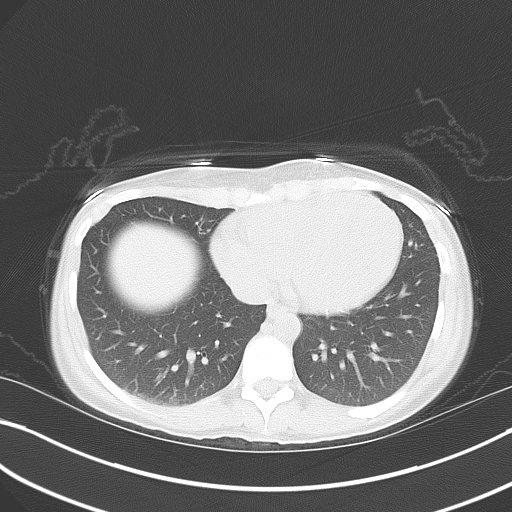
[im 19/24  soft-tissue]
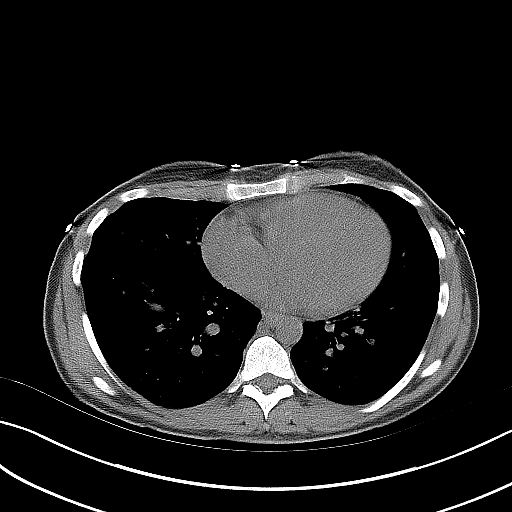
[im 19/24  lung]
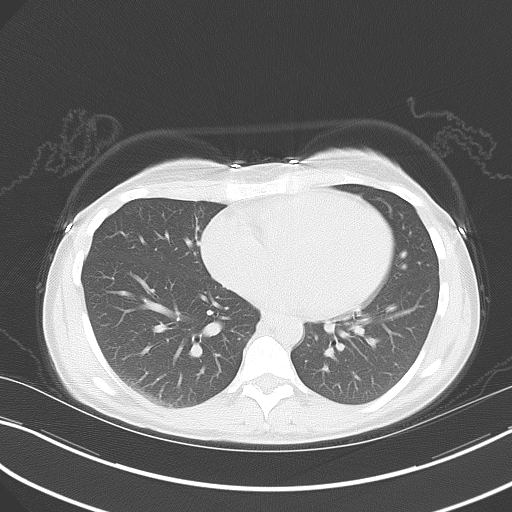

[Series 5: coronal · coronal · 0.74mm/px · 3 of 101 slices shown, 4 images]
[im 34/101  soft-tissue]
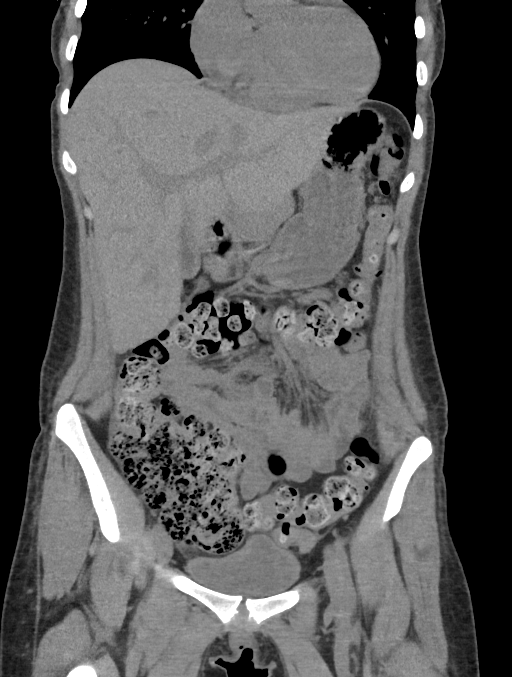
[im 45/101  soft-tissue]
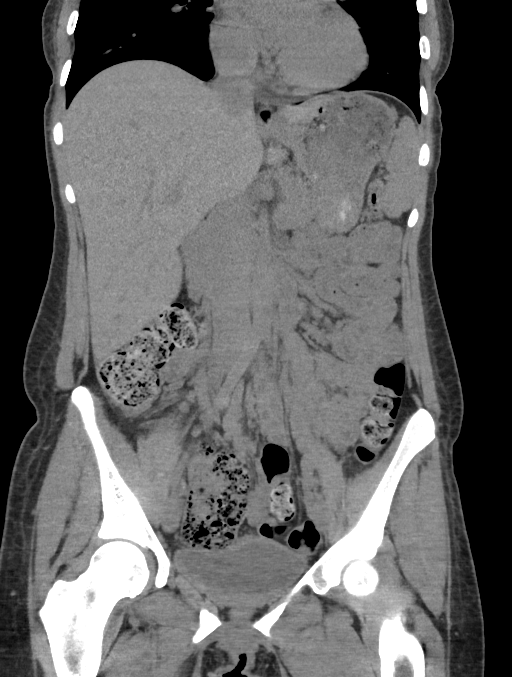
[im 45/101  bone]
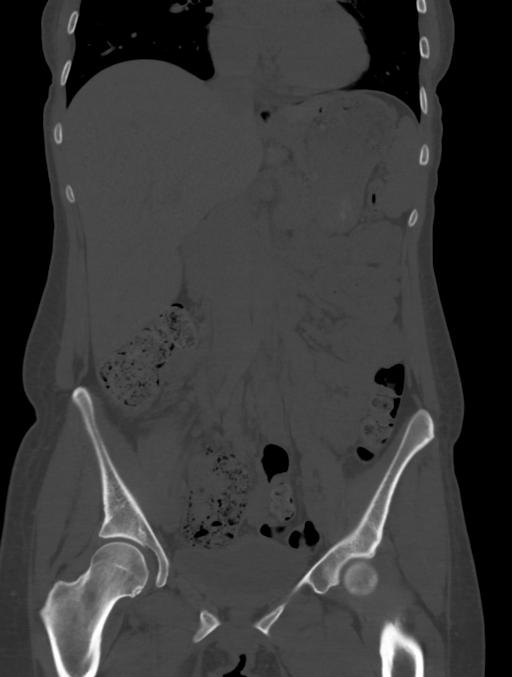
[im 56/101  soft-tissue]
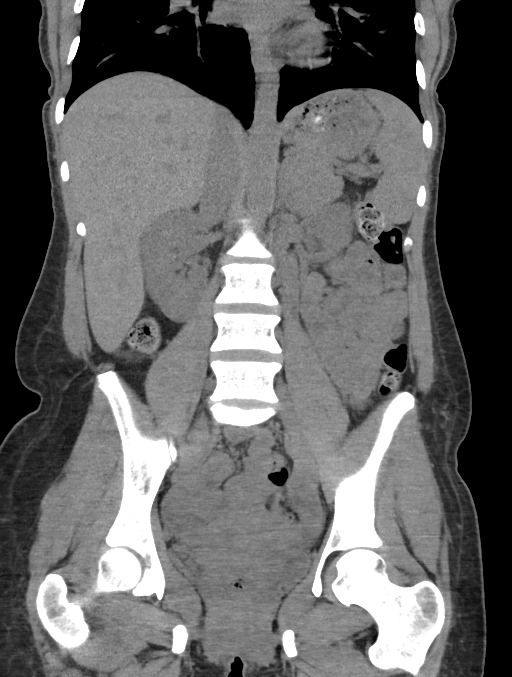

[Series 6: sagittal · sagittal · 0.46mm/px · 1 of 158 slices shown, 2 images]
[im 53/158  soft-tissue]
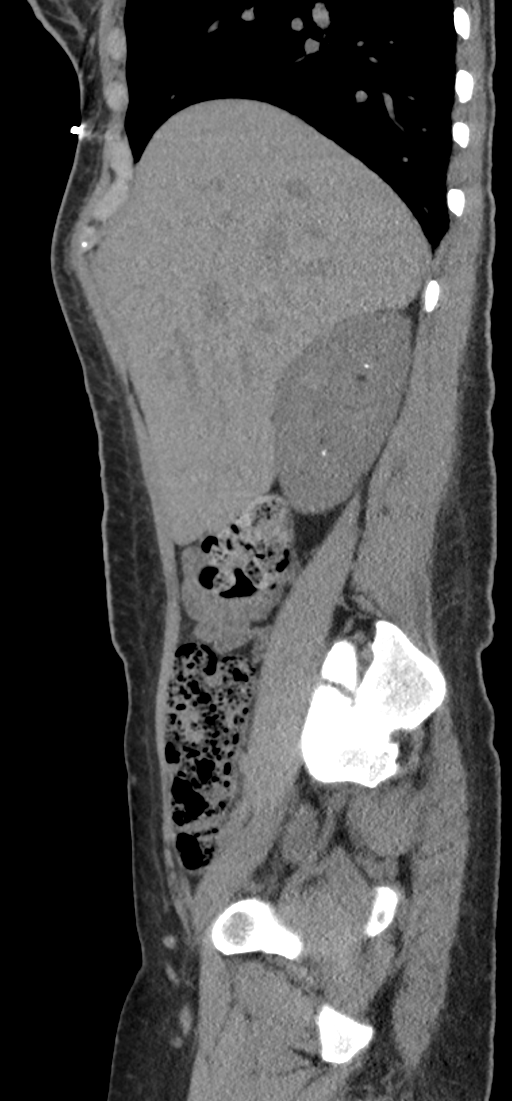
[im 53/158  bone]
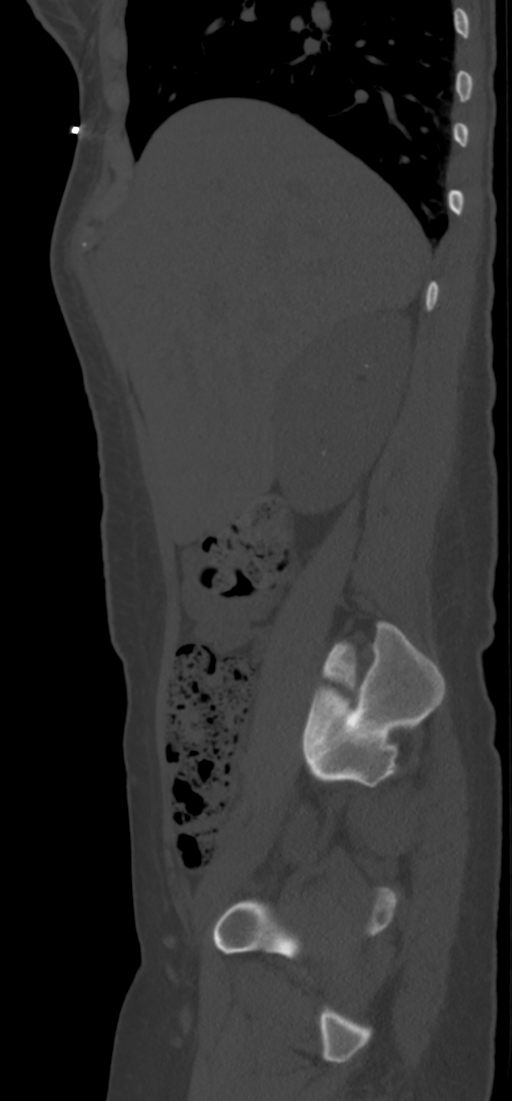

[8 of 46 positions shown; findings below may reference images not displayed]

FINDINGS: Lower chest: Lung bases demonstrate no acute consolidation or
pleural effusion. Heart size upper normal. Mild pectus deformity of
the lower anterior chest wall.

Hepatobiliary: Liver is enlarged at 20 cm. No focal hepatic
abnormality. No calcified gallstone

Pancreas: Unremarkable. No pancreatic ductal dilatation or
surrounding inflammatory changes.

Spleen: Normal in size without focal abnormality.

Adrenals/Urinary Tract: Adrenal glands within normal limits.
Punctate nonobstructing stones are present within the mid and lower
pole of the left kidney. There are multiple nonobstructing stones
present within the right kidney, largest is seen in the upper pole
thigh and measures 3 mm in size. No definite ureteral stone is
visualized. No hydronephrosis. Bladder unremarkable.

Stomach/Bowel: Stomach nonenlarged. No dilated small bowel. Large
amount of stool throughout the colon with large feces burden in the
right colon. Appendix is not clearly identified but there is no
right lower quadrant inflammation. No colon wall thickening

Vascular/Lymphatic: No significant vascular findings are present. No
enlarged abdominal or pelvic lymph nodes.

Reproductive: Uterus and bilateral adnexa are unremarkable.

Other: No abdominal wall hernia or abnormality. No abdominopelvic
ascites.

Musculoskeletal: No acute or significant osseous findings.
IMPRESSION: 1. Multiple right greater than left nonobstructing intrarenal
stones. No evidence for ureteral stone or hydronephrosis.
2. No CT evidence for acute intra-abdominal or pelvic pathology.
3. Enlarged liver

## 2018-05-10 ENCOUNTER — Emergency Department
Admission: EM | Admit: 2018-05-10 | Discharge: 2018-05-10 | Disposition: A | Discharge status: Home / Self Care | Payer: 59 | Attending: Emergency Medicine | Admitting: Emergency Medicine

## 2018-05-10 ENCOUNTER — Emergency Department: Payer: 59

## 2018-05-10 ENCOUNTER — Other Ambulatory Visit: Payer: Self-pay

## 2018-05-10 DIAGNOSIS — O468X1 Other antepartum hemorrhage, first trimester: Secondary | ICD-10-CM

## 2018-05-10 DIAGNOSIS — F17210 Nicotine dependence, cigarettes, uncomplicated: Secondary | ICD-10-CM | POA: Diagnosis not present

## 2018-05-10 DIAGNOSIS — O209 Hemorrhage in early pregnancy, unspecified: Secondary | ICD-10-CM | POA: Diagnosis present

## 2018-05-10 DIAGNOSIS — O4411 Placenta previa with hemorrhage, first trimester: Secondary | ICD-10-CM | POA: Insufficient documentation

## 2018-05-10 DIAGNOSIS — Z3A01 Less than 8 weeks gestation of pregnancy: Secondary | ICD-10-CM | POA: Insufficient documentation

## 2018-05-10 DIAGNOSIS — Z79899 Other long term (current) drug therapy: Secondary | ICD-10-CM | POA: Insufficient documentation

## 2018-05-10 DIAGNOSIS — O418X10 Other specified disorders of amniotic fluid and membranes, first trimester, not applicable or unspecified: Secondary | ICD-10-CM

## 2018-05-10 DIAGNOSIS — O4690 Antepartum hemorrhage, unspecified, unspecified trimester: Secondary | ICD-10-CM

## 2018-05-10 LAB — CBC
HCT: 35.9 % (ref 35.0–47.0)
Hemoglobin: 12.5 g/dL (ref 12.0–16.0)
MCH: 32.7 pg (ref 26.0–34.0)
MCHC: 34.7 g/dL (ref 32.0–36.0)
MCV: 94 fL (ref 80.0–100.0)
Platelets: 193 10*3/uL (ref 150–440)
RBC: 3.82 MIL/uL (ref 3.80–5.20)
RDW: 13.3 % (ref 11.5–14.5)
WBC: 11.2 10*3/uL — ABNORMAL HIGH (ref 3.6–11.0)

## 2018-05-10 LAB — BASIC METABOLIC PANEL
Anion gap: 5 (ref 5–15)
BUN: 10 mg/dL (ref 6–20)
CO2: 26 mmol/L (ref 22–32)
Calcium: 8.9 mg/dL (ref 8.9–10.3)
Chloride: 105 mmol/L (ref 98–111)
Creatinine, Ser: 0.58 mg/dL (ref 0.44–1.00)
GFR calc Af Amer: >60 mL/min (ref >60)
GFR calc non Af Amer: >60 mL/min (ref >60)
Glucose, Bld: 115 mg/dL — ABNORMAL HIGH (ref 70–99)
Potassium: 3.5 mmol/L (ref 3.5–5.1)
Sodium: 136 mmol/L (ref 135–145)

## 2018-05-10 LAB — HCG, QUANTITATIVE, PREGNANCY: hCG, Beta Chain, Quant, S: 2898 m[IU]/mL — ABNORMAL HIGH (ref <5)

## 2018-05-10 MED ORDER — ACETAMINOPHEN 500 MG PO TABS
1000.0000 mg | ORAL_TABLET | Freq: Once | ORAL | Status: AC
  Administered 2018-05-10: 1000 mg via ORAL
  Filled 2018-05-10: qty 2

## 2018-05-10 MED ORDER — ONDANSETRON 4 MG PO TBDP
4.0000 mg | ORAL_TABLET | Freq: Once | ORAL | Status: AC
  Administered 2018-05-10: 4 mg via ORAL
  Filled 2018-05-10: qty 1
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
## 2018-05-10 NOTE — Discharge Instructions (Addendum)
Your ultrasound is consistent with 5 weeks and 2 days pregnancy.  Please follow pelvic rest instructions attached.  Follow-up with your OB/GYN in 2 weeks for repeat ultrasound to ensure this is a viable pregnancy.  Return to the emergency room if you have severe vaginal bleeding, with feel like you are going to pass out, new or worsening abdominal pain.

## 2018-05-10 NOTE — ED Triage Notes (Signed)
Pt arrives to ED via POV from home with c/o vaginal bleeding x2 weeks. Pt states it "comes and goes"; "it starts at work but then stops when I get home and relax". No prenatal care, unknown how long she has been pregnant (last cycle reported July 15th). Pt denies passing tissue or clots, reports it's mainly spotting.

## 2018-05-27 NOTE — Progress Notes (Signed)
Pt presents today after visiting the ED on 05/10/18 for vaginal bleeding and pain.  7 wks per pt BP 92/59 Pulse 83 Wt 172 lbs Ht 5 11

## 2018-05-28 ENCOUNTER — Other Ambulatory Visit: Payer: Self-pay | Admitting: Obstetrics and Gynecology

## 2018-05-28 ENCOUNTER — Encounter: Payer: Self-pay | Admitting: Obstetrics and Gynecology

## 2018-05-28 ENCOUNTER — Ambulatory Visit (INDEPENDENT_AMBULATORY_CARE_PROVIDER_SITE_OTHER): Payer: Medicaid Other | Admitting: Obstetrics and Gynecology

## 2018-05-28 VITALS — BP 92/59 | HR 83 | Wt 172.0 lb

## 2018-05-28 DIAGNOSIS — N926 Irregular menstruation, unspecified: Secondary | ICD-10-CM

## 2018-05-28 DIAGNOSIS — O418X10 Other specified disorders of amniotic fluid and membranes, first trimester, not applicable or unspecified: Secondary | ICD-10-CM | POA: Diagnosis not present

## 2018-05-28 DIAGNOSIS — O468X9 Other antepartum hemorrhage, unspecified trimester: Secondary | ICD-10-CM

## 2018-05-28 DIAGNOSIS — O209 Hemorrhage in early pregnancy, unspecified: Secondary | ICD-10-CM | POA: Diagnosis not present

## 2018-05-28 DIAGNOSIS — O468X1 Other antepartum hemorrhage, first trimester: Secondary | ICD-10-CM | POA: Diagnosis not present

## 2018-05-28 DIAGNOSIS — O418X90 Other specified disorders of amniotic fluid and membranes, unspecified trimester, not applicable or unspecified: Secondary | ICD-10-CM

## 2018-05-28 DIAGNOSIS — Z3201 Encounter for pregnancy test, result positive: Secondary | ICD-10-CM

## 2018-05-28 LAB — POCT URINALYSIS DIPSTICK OB
Bilirubin, UA: NEGATIVE
Blood, UA: NEGATIVE
Glucose, UA: NEGATIVE
Ketones, UA: NEGATIVE
Leukocytes, UA: NEGATIVE
Nitrite, UA: NEGATIVE
POC,PROTEIN,UA: NEGATIVE
Spec Grav, UA: 1.015 (ref 1.010–1.025)
Urobilinogen, UA: 0.2 E.U./dL
pH, UA: 6.5 (ref 5.0–8.0)
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
## 2018-05-29 ENCOUNTER — Other Ambulatory Visit: Payer: Self-pay | Admitting: Obstetrics and Gynecology

## 2018-05-29 ENCOUNTER — Ambulatory Visit (INDEPENDENT_AMBULATORY_CARE_PROVIDER_SITE_OTHER): Payer: Medicaid Other

## 2018-05-29 DIAGNOSIS — Z3201 Encounter for pregnancy test, result positive: Secondary | ICD-10-CM

## 2018-05-29 DIAGNOSIS — Z3A01 Less than 8 weeks gestation of pregnancy: Secondary | ICD-10-CM | POA: Diagnosis not present

## 2018-05-29 DIAGNOSIS — O208 Other hemorrhage in early pregnancy: Secondary | ICD-10-CM

## 2018-05-29 DIAGNOSIS — O468X9 Other antepartum hemorrhage, unspecified trimester: Secondary | ICD-10-CM

## 2018-05-29 DIAGNOSIS — O418X90 Other specified disorders of amniotic fluid and membranes, unspecified trimester, not applicable or unspecified: Secondary | ICD-10-CM

## 2018-05-29 DIAGNOSIS — N926 Irregular menstruation, unspecified: Secondary | ICD-10-CM

## 2018-06-11 ENCOUNTER — Ambulatory Visit: Payer: Medicaid Other | Admitting: Obstetrics and Gynecology

## 2018-06-11 VITALS — BP 120/70 | HR 86 | Ht 71.0 in | Wt 173.9 lb

## 2018-06-11 DIAGNOSIS — N926 Irregular menstruation, unspecified: Secondary | ICD-10-CM

## 2018-06-11 NOTE — Patient Instructions (Signed)
First Trimester of Pregnancy The first trimester of pregnancy is from week 1 until the end of week 13 (months 1 through 3). During this time, your baby will begin to develop inside you. At 6-8 weeks, the eyes and face are formed, and the heartbeat can be seen on ultrasound. At the end of 12 weeks, all the baby's organs are formed. Prenatal care is all the medical care you receive before the birth of your baby. Make sure you get good prenatal care and follow all of your doctor's instructions. Follow these instructions at home: Medicines  Take over-the-counter and prescription medicines only as told by your doctor. Some medicines are safe and some medicines are not safe during pregnancy.  Take a prenatal vitamin that contains at least 600 micrograms (mcg) of folic acid.  If you have trouble pooping (constipation), take medicine that will make your stool soft (stool softener) if your doctor approves. Eating and drinking  Eat regular, healthy meals.  Your doctor will tell you the amount of weight gain that is right for you.  Avoid raw meat and uncooked cheese.  If you feel sick to your stomach (nauseous) or throw up (vomit): ? Eat 4 or 5 small meals a day instead of 3 large meals. ? Try eating a few soda crackers. ? Drink liquids between meals instead of during meals.  To prevent constipation: ? Eat foods that are high in fiber, like fresh fruits and vegetables, whole grains, and beans. ? Drink enough fluids to keep your pee (urine) clear or pale yellow. Activity  Exercise only as told by your doctor. Stop exercising if you have cramps or pain in your lower belly (abdomen) or low back.  Do not exercise if it is too hot, too humid, or if you are in a place of great height (high altitude).  Try to avoid standing for long periods of time. Move your legs often if you must stand in one place for a long time.  Avoid heavy lifting.  Wear low-heeled shoes. Sit and stand up straight.  You  can have sex unless your doctor tells you not to. Relieving pain and discomfort  Wear a good support bra if your breasts are sore.  Take warm water baths (sitz baths) to soothe pain or discomfort caused by hemorrhoids. Use hemorrhoid cream if your doctor says it is okay.  Rest with your legs raised if you have leg cramps or low back pain.  If you have puffy, bulging veins (varicose veins) in your legs: ? Wear support hose or compression stockings as told by your doctor. ? Raise (elevate) your feet for 15 minutes, 3-4 times a day. ? Limit salt in your food. Prenatal care  Schedule your prenatal visits by the twelfth week of pregnancy.  Write down your questions. Take them to your prenatal visits.  Keep all your prenatal visits as told by your doctor. This is important. Safety  Wear your seat belt at all times when driving.  Make a list of emergency phone numbers. The list should include numbers for family, friends, the hospital, and police and fire departments. General instructions  Ask your doctor for a referral to a local prenatal class. Begin classes no later than at the start of month 6 of your pregnancy.  Ask for help if you need counseling or if you need help with nutrition. Your doctor can give you advice or tell you where to go for help.  Do not use hot tubs, steam rooms, or   saunas.  Do not douche or use tampons or scented sanitary pads.  Do not cross your legs for long periods of time.  Avoid all herbs and alcohol. Avoid drugs that are not approved by your doctor.  Do not use any tobacco products, including cigarettes, chewing tobacco, and electronic cigarettes. If you need help quitting, ask your doctor. You may get counseling or other support to help you quit.  Avoid cat litter boxes and soil used by cats. These carry germs that can cause birth defects in the baby and can cause a loss of your baby (miscarriage) or stillbirth.  Visit your dentist. At home, brush  your teeth with a soft toothbrush. Be gentle when you floss. Contact a doctor if:  You are dizzy.  You have mild cramps or pressure in your lower belly.  You have a nagging pain in your belly area.  You continue to feel sick to your stomach, you throw up, or you have watery poop (diarrhea).  You have a bad smelling fluid coming from your vagina.  You have pain when you pee (urinate).  You have increased puffiness (swelling) in your face, hands, legs, or ankles. Get help right away if:  You have a fever.  You are leaking fluid from your vagina.  You have spotting or bleeding from your vagina.  You have very bad belly cramping or pain.  You gain or lose weight rapidly.  You throw up blood. It may look like coffee grounds.  You are around people who have German measles, fifth disease, or chickenpox.  You have a very bad headache.  You have shortness of breath.  You have any kind of trauma, such as from a fall or a car accident. Summary  The first trimester of pregnancy is from week 1 until the end of week 13 (months 1 through 3).  To take care of yourself and your unborn baby, you will need to eat healthy meals, take medicines only if your doctor tells you to do so, and do activities that are safe for you and your baby.  Keep all follow-up visits as told by your doctor. This is important as your doctor will have to ensure that your baby is healthy and growing well. This information is not intended to replace advice given to you by your health care provider. Make sure you discuss any questions you have with your health care provider. Document Released: 02/07/2008 Document Revised: 08/29/2016 Document Reviewed: 08/29/2016 Elsevier Interactive Patient Education  2017 [employer] Inc[employer]  

## 2018-06-11 NOTE — Progress Notes (Cosign Needed)
Bertis Ruddy presents for NOB nurse interview visit. Pregnancy confirmation done here at [facility].    G-4 .  P-  3  . Pregnancy education material explained and given. There is a cats in the home just comes in to eat.. NOB labs ordered.Marland Kitchen HIV labs and Drug screen were explained optional and she did not decline. Drug screen ordered. PNV encouraged. Genetic screening options discussed. Genetic testing:they will do at next appointment..  Pt may discuss with provider. Pt. To follow up with provider in _1.5 _ weeks for NOB physical.  FMLA paperwork went over with pt, she agreed ed. Signed, financial info discussed.  All questions answered.

## 2018-06-12 LAB — GC/CHLAMYDIA PROBE AMP
Chlamydia trachomatis, NAA: NEGATIVE
Neisseria gonorrhoeae by PCR: NEGATIVE

## 2018-06-12 LAB — HIV ANTIBODY (ROUTINE TESTING W REFLEX): HIV Screen 4th Generation wRfx: NONREACTIVE

## 2018-06-12 LAB — URINALYSIS, ROUTINE W REFLEX MICROSCOPIC
Bilirubin, UA: NEGATIVE
Glucose, UA: NEGATIVE
Ketones, UA: NEGATIVE
Leukocytes, UA: NEGATIVE
Nitrite, UA: NEGATIVE
Protein, UA: NEGATIVE
RBC, UA: NEGATIVE
Specific Gravity, UA: 1.024 (ref 1.005–1.030)
Urobilinogen, Ur: 0.2 mg/dL (ref 0.2–1.0)
pH, UA: 5.5 (ref 5.0–7.5)

## 2018-06-12 LAB — HEPATITIS B SURFACE ANTIGEN: Hepatitis B Surface Ag: NEGATIVE

## 2018-06-12 LAB — ABO AND RH: Rh Factor: POSITIVE

## 2018-06-12 LAB — RPR: RPR Ser Ql: NONREACTIVE

## 2018-06-12 LAB — RUBELLA SCREEN: Rubella Antibodies, IGG: 2.4 index (ref >0.99)

## 2018-06-12 LAB — VARICELLA ZOSTER ANTIBODY, IGG: Varicella zoster IgG: 486 index (ref >165)

## 2018-06-12 LAB — ANTIBODY SCREEN: Antibody Screen: NEGATIVE

## 2018-06-13 LAB — URINE CULTURE

## 2018-06-20 ENCOUNTER — Other Ambulatory Visit (HOSPITAL_COMMUNITY)
Admission: RE | Admit: 2018-06-20 | Discharge: 2018-06-20 | Disposition: A | Discharge status: Home / Self Care | Payer: Managed Care, Other (non HMO) | Source: Ambulatory Visit | Attending: Obstetrics and Gynecology | Admitting: Obstetrics and Gynecology

## 2018-06-20 ENCOUNTER — Encounter: Payer: Self-pay | Admitting: Obstetrics and Gynecology

## 2018-06-20 ENCOUNTER — Ambulatory Visit (INDEPENDENT_AMBULATORY_CARE_PROVIDER_SITE_OTHER): Payer: Medicaid Other | Admitting: Obstetrics and Gynecology

## 2018-06-20 VITALS — BP 116/67 | HR 78 | Wt 175.4 lb

## 2018-06-20 DIAGNOSIS — O99331 Smoking (tobacco) complicating pregnancy, first trimester: Secondary | ICD-10-CM | POA: Diagnosis not present

## 2018-06-20 DIAGNOSIS — Z3491 Encounter for supervision of normal pregnancy, unspecified, first trimester: Secondary | ICD-10-CM

## 2018-06-20 DIAGNOSIS — Z3A10 10 weeks gestation of pregnancy: Secondary | ICD-10-CM

## 2018-06-20 DIAGNOSIS — Z87440 Personal history of urinary (tract) infections: Secondary | ICD-10-CM | POA: Diagnosis not present

## 2018-06-20 DIAGNOSIS — F17200 Nicotine dependence, unspecified, uncomplicated: Secondary | ICD-10-CM

## 2018-06-20 DIAGNOSIS — F17210 Nicotine dependence, cigarettes, uncomplicated: Secondary | ICD-10-CM

## 2018-06-20 LAB — POCT URINALYSIS DIPSTICK OB
Bilirubin, UA: NEGATIVE
Glucose, UA: NEGATIVE
Ketones, UA: NEGATIVE
Leukocytes, UA: NEGATIVE
Nitrite, UA: NEGATIVE
POC,PROTEIN,UA: NEGATIVE
Spec Grav, UA: 1.01 (ref 1.010–1.025)
Urobilinogen, UA: 0.2 E.U./dL
pH, UA: 6.5 (ref 5.0–8.0)

## 2018-06-20 NOTE — Addendum Note (Signed)
Addended by: Haskel Khan on: 06/20/2018 04:41 PM   Modules accepted: Orders
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
## 2018-06-20 NOTE — Progress Notes (Signed)
NOB PE- pt is having some lower abd cramping, states she is having this while at work

## 2018-06-20 NOTE — Patient Instructions (Addendum)
Thank you for enrolling in MyChart. Please follow the instructions below to securely access your online medical record. MyChart allows you to send messages to your doctor, view your test results, renew your prescriptions, schedule appointments, and more.  How Do I Sign Up? 1. In your Internet browser, go to [email or web address] 2. Click on the New  User? link in the Sign In box.  3. Enter your MyChart Access Code exactly as it appears below. You will not need to use this code after you have completed the sign-up process. If you do not sign up before the expiration date, you must request a new code. MyChart Access Code: [email or web address] Expires: 06/24/2018  6:16 AM  4. Enter the last four digits of your Social Security Number (xxxx) and Date of Birth (mm/dd/yyyy) as indicated and click Next. You will be taken to the next sign-up page. 5. Create a MyChart ID. This will be your MyChart login ID and cannot be changed, so think of one that is secure and easy to remember. 6. Create a MyChart password. You can change your password at any time. 7. Enter your Password Reset Question and Answer and click Next. This can be used at a later time if you forget your password.  8. Select your communication preference, and if applicable enter your e-mail address. You will receive e-mail notification when new information is available in MyChart by choosing to receive e-mail notifications and filling in your e-mail. 9. Click Sign In. You can now view your medical record.   Additional Information If you have questions, you can email [email or web address] or call [phone] to talk to our MyChart staff. Remember, MyChart is NOT to be used for urgent needs. For medical emergencies, dial 911.  Second Trimester of Pregnancy The second trimester is from week 13 through week 28, month 4 through 6. This is often the time in pregnancy that you feel your best. Often times, morning sickness has  lessened or quit. You may have more energy, and you may get hungry more often. Your unborn baby (fetus) is growing rapidly. At the end of the sixth month, he or she is about 9 inches long and weighs about 1 pounds. You will likely feel the baby move (quickening) between 18 and 20 weeks of pregnancy. Follow these instructions at home:  Avoid all smoking, herbs, and alcohol. Avoid drugs not approved by your doctor.  Do not use any tobacco products, including cigarettes, chewing tobacco, and electronic cigarettes. If you need help quitting, ask your doctor. You may get counseling or other support to help you quit.  Only take medicine as told by your doctor. Some medicines are safe and some are not during pregnancy.  Exercise only as told by your doctor. Stop exercising if you start having cramps.  Eat regular, healthy meals.  Wear a good support bra if your breasts are tender.  Do not use hot tubs, steam rooms, or saunas.  Wear your seat belt when driving.  Avoid raw meat, uncooked cheese, and liter boxes and soil used by cats.  Take your prenatal vitamins.  Take 1500-2000 milligrams of calcium daily starting at the 20th week of pregnancy until you deliver your baby.  Try taking medicine that helps you poop (stool softener) as needed, and if your doctor approves. Eat more fiber by eating fresh fruit, vegetables, and whole grains. Drink enough fluids to keep your pee (urine) clear or pale yellow.  Take warm water baths (sitz baths)  to soothe pain or discomfort caused by hemorrhoids. Use hemorrhoid cream if your doctor approves.  If you have puffy, bulging veins (varicose veins), wear support hose. Raise (elevate) your feet for 15 minutes, 3-4 times a day. Limit salt in your diet.  Avoid heavy lifting, wear low heals, and sit up straight.  Rest with your legs raised if you have leg cramps or low back pain.  Visit your dentist if you have not gone during your pregnancy. Use a soft  toothbrush to brush your teeth. Be gentle when you floss.  You can have sex (intercourse) unless your doctor tells you not to.  Go to your doctor visits. Get help if:  You feel dizzy.  You have mild cramps or pressure in your lower belly (abdomen).  You have a nagging pain in your belly area.  You continue to feel sick to your stomach (nauseous), throw up (vomit), or have watery poop (diarrhea).  You have bad smelling fluid coming from your vagina.  You have pain with peeing (urination). Get help right away if:  You have a fever.  You are leaking fluid from your vagina.  You have spotting or bleeding from your vagina.  You have severe belly cramping or pain.  You lose or gain weight rapidly.  You have trouble catching your breath and have chest pain.  You notice sudden or extreme puffiness (swelling) of your face, hands, ankles, feet, or legs.  You have not felt the baby move in over an hour.  You have severe headaches that do not go away with medicine.  You have vision changes. This information is not intended to replace advice given to you by your health care provider. Make sure you discuss any questions you have with your health care provider. Document Released: 11/15/2009 Document Revised: 01/27/2016 Document Reviewed: 10/22/2012 Elsevier Interactive Patient Education  2017 [employer].

## 2018-06-21 LAB — CBC WITH DIFFERENTIAL/PLATELET
Basophils Absolute: 0 10*3/uL (ref 0.0–0.2)
Basos: 1 %
EOS (ABSOLUTE): 0.2 10*3/uL (ref 0.0–0.4)
Eos: 3 %
Hematocrit: 34.1 % (ref 34.0–46.6)
Hemoglobin: 11.6 g/dL (ref 11.1–15.9)
Immature Grans (Abs): 0 10*3/uL (ref 0.0–0.1)
Immature Granulocytes: 0 %
Lymphocytes Absolute: 2.6 10*3/uL (ref 0.7–3.1)
Lymphs: 32 %
MCH: 32 pg (ref 26.6–33.0)
MCHC: 34 g/dL (ref 31.5–35.7)
MCV: 94 fL (ref 79–97)
Monocytes Absolute: 0.5 10*3/uL (ref 0.1–0.9)
Monocytes: 6 %
Neutrophils Absolute: 4.8 10*3/uL (ref 1.4–7.0)
Neutrophils: 58 %
Platelets: 191 10*3/uL (ref 150–450)
RBC: 3.63 x10E6/uL — ABNORMAL LOW (ref 3.77–5.28)
RDW: 12.2 % — ABNORMAL LOW (ref 12.3–15.4)
WBC: 8.2 10*3/uL (ref 3.4–10.8)

## 2018-06-22 LAB — URINE CULTURE

## 2018-06-24 LAB — CYTOLOGY - PAP
Chlamydia: NEGATIVE
Diagnosis: NEGATIVE
Neisseria Gonorrhea: NEGATIVE

## 2018-06-24 LAB — CANNABINOID (GC/MS), URINE
Cannabinoid: POSITIVE — AB
Carboxy THC (GC/MS): 35 ng/mL

## 2018-06-24 LAB — MONITOR DRUG PROFILE 14(MW)
Amphetamine Scrn, Ur: NEGATIVE ng/mL
BARBITURATE SCREEN URINE: NEGATIVE ng/mL
BENZODIAZEPINE SCREEN, URINE: NEGATIVE ng/mL
Buprenorphine, Urine: NEGATIVE ng/mL
Cocaine (Metab) Scrn, Ur: NEGATIVE ng/mL
Creatinine(Crt), U: 124.4 mg/dL (ref 20.0–300.0)
Fentanyl, Urine: NEGATIVE pg/mL
Meperidine Screen, Urine: NEGATIVE ng/mL
Methadone Screen, Urine: NEGATIVE ng/mL
OXYCODONE+OXYMORPHONE UR QL SCN: NEGATIVE ng/mL
Opiate Scrn, Ur: NEGATIVE ng/mL
Ph of Urine: 6 (ref 4.5–8.9)
Phencyclidine Qn, Ur: NEGATIVE ng/mL
Propoxyphene Scrn, Ur: NEGATIVE ng/mL
SPECIFIC GRAVITY: 1.022
Tramadol Screen, Urine: NEGATIVE ng/mL

## 2018-07-02 ENCOUNTER — Encounter: Payer: Self-pay | Admitting: Obstetrics and Gynecology

## 2018-07-08 ENCOUNTER — Telehealth: Payer: Self-pay | Admitting: Certified Nurse Midwife

## 2018-07-08 NOTE — Telephone Encounter (Signed)
Patients mother came in stating the patient has been incarcerated. She needs a letter stating she needs to take prenatal vitamins daily. She also needs a letter stating when her next appointment is. Her mother would like to pick them up when ready. Thanks

## 2018-07-09 ENCOUNTER — Telehealth: Payer: Self-pay

## 2018-07-09 NOTE — Telephone Encounter (Signed)
Contacted mother- letter ready for pickup.

## 2018-07-16 ENCOUNTER — Encounter: Payer: Self-pay | Admitting: Certified Nurse Midwife

## 2018-07-18 ENCOUNTER — Encounter: Payer: Self-pay | Admitting: Certified Nurse Midwife

## 2019-01-11 ENCOUNTER — Emergency Department
Admission: EM | Admit: 2019-01-11 | Discharge: 2019-01-11 | Disposition: A | Discharge status: Home / Self Care | Payer: Managed Care, Other (non HMO) | Attending: Emergency Medicine | Admitting: Emergency Medicine

## 2019-01-11 ENCOUNTER — Other Ambulatory Visit: Payer: Self-pay

## 2019-01-11 ENCOUNTER — Encounter: Payer: Self-pay | Admitting: Emergency Medicine

## 2019-01-11 DIAGNOSIS — N3000 Acute cystitis without hematuria: Secondary | ICD-10-CM | POA: Diagnosis not present

## 2019-01-11 DIAGNOSIS — Z87891 Personal history of nicotine dependence: Secondary | ICD-10-CM | POA: Diagnosis not present

## 2019-01-11 DIAGNOSIS — R1032 Left lower quadrant pain: Secondary | ICD-10-CM | POA: Diagnosis present

## 2019-01-11 LAB — CBC
HCT: 37.9 % (ref 36.0–46.0)
Hemoglobin: 12.3 g/dL (ref 12.0–15.0)
MCH: 31.9 pg (ref 26.0–34.0)
MCHC: 32.5 g/dL (ref 30.0–36.0)
MCV: 98.4 fL (ref 80.0–100.0)
Platelets: 313 10*3/uL (ref 150–400)
RBC: 3.85 MIL/uL — ABNORMAL LOW (ref 3.87–5.11)
RDW: 13.2 % (ref 11.5–15.5)
WBC: 10.4 10*3/uL (ref 4.0–10.5)
nRBC: 0 % (ref 0.0–0.2)

## 2019-01-11 LAB — COMPREHENSIVE METABOLIC PANEL
ALT: 25 U/L (ref 0–44)
AST: 21 U/L (ref 15–41)
Albumin: 3.6 g/dL (ref 3.5–5.0)
Alkaline Phosphatase: 134 U/L — ABNORMAL HIGH (ref 38–126)
Anion gap: 7 (ref 5–15)
BUN: 17 mg/dL (ref 6–20)
CO2: 25 mmol/L (ref 22–32)
Calcium: 9.2 mg/dL (ref 8.9–10.3)
Chloride: 109 mmol/L (ref 98–111)
Creatinine, Ser: 0.65 mg/dL (ref 0.44–1.00)
GFR calc Af Amer: >60 mL/min (ref >60)
GFR calc non Af Amer: >60 mL/min (ref >60)
Glucose, Bld: 87 mg/dL (ref 70–99)
Potassium: 4.2 mmol/L (ref 3.5–5.1)
Sodium: 141 mmol/L (ref 135–145)
Total Bilirubin: 0.3 mg/dL (ref 0.3–1.2)
Total Protein: 7.3 g/dL (ref 6.5–8.1)

## 2019-01-11 LAB — URINALYSIS, COMPLETE (UACMP) WITH MICROSCOPIC
Bacteria, UA: NONE SEEN
Bilirubin Urine: NEGATIVE
Glucose, UA: NEGATIVE mg/dL
Ketones, ur: NEGATIVE mg/dL
Nitrite: NEGATIVE
Protein, ur: 30 mg/dL — AB
RBC / HPF: >50 RBC/hpf — ABNORMAL HIGH (ref 0–5)
Specific Gravity, Urine: 1.023 (ref 1.005–1.030)
pH: 6 (ref 5.0–8.0)

## 2019-01-11 LAB — LIPASE, BLOOD: Lipase: 30 U/L (ref 11–51)

## 2019-01-11 MED ORDER — SODIUM CHLORIDE 0.9% FLUSH: 3.0000 mL | Freq: Once | INTRAVENOUS | Status: DC

## 2019-01-11 MED ORDER — CEPHALEXIN 500 MG PO CAPS: 500.0000 mg | ORAL_CAPSULE | Freq: Two times a day (BID) | ORAL | 0 refills | Status: AC

## 2019-01-11 NOTE — ED Triage Notes (Signed)
Pt to ED via POV c/o abdominal pain. Pt states that she is 1 week post partum. Pt states that she delivered at another hospital in [place]. Pt denies complications with delivery. Pt states that she has been having left lower abdominal pain x 3 months and diarrhea that started last night. Pt states that she called the hospital she delivered at and they advised her to come here and be evaluated for "something that started with a D". Pt denies N/V/fever. Pt is in NAD.

## 2019-01-11 NOTE — Discharge Instructions (Addendum)
Take the antibiotic as prescribed and finish the full course.  You can take Tylenol or ibuprofen as needed for pain.  Return to the ER for new, worsening, or persistent severe pain, vomiting, fever, worsening bleeding, or any other new or worsening symptoms that concern you.  You should call your OB/GYN in [place] in a few days to check in.  We have also given you information for one of the OB/GYN practices in our area.
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
## 2019-05-27 ENCOUNTER — Other Ambulatory Visit: Payer: Self-pay

## 2019-05-27 ENCOUNTER — Ambulatory Visit (LOCAL_COMMUNITY_HEALTH_CENTER): Payer: Managed Care, Other (non HMO) | Admitting: Advanced Practice Midwife

## 2019-05-27 ENCOUNTER — Encounter: Payer: Self-pay | Admitting: Advanced Practice Midwife

## 2019-05-27 VITALS — BP 115/76 | Ht 67.0 in | Wt 195.6 lb

## 2019-05-27 DIAGNOSIS — Z3009 Encounter for other general counseling and advice on contraception: Secondary | ICD-10-CM

## 2019-05-27 DIAGNOSIS — Z30013 Encounter for initial prescription of injectable contraceptive: Secondary | ICD-10-CM

## 2019-05-27 DIAGNOSIS — Z3042 Encounter for surveillance of injectable contraceptive: Secondary | ICD-10-CM

## 2019-05-27 LAB — PREGNANCY, URINE: Preg Test, Ur: NEGATIVE

## 2019-05-27 MED ORDER — MEDROXYPROGESTERONE ACETATE 150 MG/ML IM SUSP
150.0000 mg | Freq: Once | INTRAMUSCULAR | Status: AC
  Administered 2019-05-27: 150 mg via INTRAMUSCULAR

## 2019-05-27 NOTE — Progress Notes (Signed)
   [facility] problem visit  [place] Department  Subjective:  Nicole Kramer is a 23 y.o.smoker being seen today for DMPA restart.  Had baby 01/03/19 and received DMPA 01/05/19.     Chief Complaint  Patient presents with  . Contraception    HPI Late for DMPA.  Last DMPA 01/05/19.  PP coitus TNTC with condoms.  Last sex yesterday, time before was 05/23/19 with condom.  Had PP exam at [facility] 02/2019.  Bottlefeeding.  Wants DMPA only.  LMP before birth.  Does the patient have a current or past history of drug use? Yes   No components found for: HCV]   Health Maintenance Due  Topic Date Due  . INFLUENZA VACCINE  04/05/2019    ROS  The following portions of the patient's history were reviewed and updated as appropriate: allergies, current medications, past family history, past medical history, past social history, past surgical history and problem list. Problem list updated.   See flowsheet for other program required questions.  Objective:   Vitals:   05/27/19 1012  BP: 115/76  Weight: 195 lb 9.6 oz (88.7 kg)  Height: 5\' 7"  (1.702 m)    Physical Exam  n/a  Assessment and Plan:  Nicole Kramer is a 23 y.o. female presenting to the [facility] Department for a Women's Health problem visit  1. Family planning If PT neg today may have DMPA 150 mg IM x1.  Please counsel needs home PT in 2 wks and call if +.  Pt declines ECP Immunization nurse consult - Pregnancy, urine     Return for 11-13 wk DMPA.  No future appointments.  Herbie Saxon, CNM

## 2019-05-27 NOTE — Progress Notes (Signed)
In for Depo; rec'd last 01/05/19 after delivery in [place]; declines HIV/RPR testing Debera Lat, RN

## 2019-05-27 NOTE — Addendum Note (Signed)
Addended by: Debera Lat on: 05/27/2019 11:22 AM   Modules accepted: Orders

## 2019-07-02 ENCOUNTER — Other Ambulatory Visit: Payer: Self-pay

## 2019-07-02 DIAGNOSIS — Z20822 Contact with and (suspected) exposure to covid-19: Secondary | ICD-10-CM

## 2019-07-03 LAB — NOVEL CORONAVIRUS, NAA: SARS-CoV-2, NAA: NOT DETECTED

## 2020-01-25 ENCOUNTER — Other Ambulatory Visit: Payer: Self-pay

## 2020-01-25 ENCOUNTER — Encounter: Payer: Self-pay | Admitting: Emergency Medicine

## 2020-01-25 DIAGNOSIS — Z79899 Other long term (current) drug therapy: Secondary | ICD-10-CM | POA: Insufficient documentation

## 2020-01-25 DIAGNOSIS — R102 Pelvic and perineal pain: Secondary | ICD-10-CM | POA: Insufficient documentation

## 2020-01-25 DIAGNOSIS — R110 Nausea: Secondary | ICD-10-CM | POA: Insufficient documentation

## 2020-01-25 DIAGNOSIS — R109 Unspecified abdominal pain: Secondary | ICD-10-CM | POA: Diagnosis present

## 2020-01-25 DIAGNOSIS — R300 Dysuria: Secondary | ICD-10-CM | POA: Insufficient documentation

## 2020-01-25 DIAGNOSIS — Z87891 Personal history of nicotine dependence: Secondary | ICD-10-CM | POA: Insufficient documentation

## 2020-01-25 LAB — CBC
HCT: 35.4 % — ABNORMAL LOW (ref 36.0–46.0)
Hemoglobin: 11.9 g/dL — ABNORMAL LOW (ref 12.0–15.0)
MCH: 31.2 pg (ref 26.0–34.0)
MCHC: 33.6 g/dL (ref 30.0–36.0)
MCV: 92.7 fL (ref 80.0–100.0)
Platelets: 226 10*3/uL (ref 150–400)
RBC: 3.82 MIL/uL — ABNORMAL LOW (ref 3.87–5.11)
RDW: 13.1 % (ref 11.5–15.5)
WBC: 8.8 10*3/uL (ref 4.0–10.5)
nRBC: 0 % (ref 0.0–0.2)

## 2020-01-25 LAB — URINALYSIS, COMPLETE (UACMP) WITH MICROSCOPIC
Bacteria, UA: NONE SEEN
Bilirubin Urine: NEGATIVE
Glucose, UA: NEGATIVE mg/dL
Hgb urine dipstick: NEGATIVE
Ketones, ur: NEGATIVE mg/dL
Leukocytes,Ua: NEGATIVE
Nitrite: NEGATIVE
Protein, ur: NEGATIVE mg/dL
Specific Gravity, Urine: 1.029 (ref 1.005–1.030)
pH: 5 (ref 5.0–8.0)

## 2020-01-25 LAB — BASIC METABOLIC PANEL
Anion gap: 9 (ref 5–15)
BUN: 12 mg/dL (ref 6–20)
CO2: 25 mmol/L (ref 22–32)
Calcium: 9.1 mg/dL (ref 8.9–10.3)
Chloride: 107 mmol/L (ref 98–111)
Creatinine, Ser: 0.62 mg/dL (ref 0.44–1.00)
GFR calc Af Amer: >60 mL/min (ref >60)
GFR calc non Af Amer: >60 mL/min (ref >60)
Glucose, Bld: 101 mg/dL — ABNORMAL HIGH (ref 70–99)
Potassium: 3.5 mmol/L (ref 3.5–5.1)
Sodium: 141 mmol/L (ref 135–145)

## 2020-01-25 LAB — POCT PREGNANCY, URINE: Preg Test, Ur: NEGATIVE

## 2020-01-25 NOTE — ED Triage Notes (Signed)
Right flank pain and lower abdominal/bladder pain.  Hx of kidney reflux.  + nausea.  No vomiting. No known fever.  VSS. Hx of frequent infection.

## 2020-01-26 ENCOUNTER — Emergency Department: Payer: Medicaid Other

## 2020-01-26 ENCOUNTER — Emergency Department
Admission: EM | Admit: 2020-01-26 | Discharge: 2020-01-26 | Disposition: A | Discharge status: Home / Self Care | Payer: Medicaid Other | Attending: Emergency Medicine | Admitting: Emergency Medicine

## 2020-01-26 DIAGNOSIS — R300 Dysuria: Secondary | ICD-10-CM

## 2020-01-26 DIAGNOSIS — R109 Unspecified abdominal pain: Secondary | ICD-10-CM

## 2020-01-26 MED ORDER — IBUPROFEN 800 MG PO TABS: 800.0000 mg | ORAL_TABLET | Freq: Three times a day (TID) | ORAL | 0 refills | Status: DC | PRN

## 2020-01-26 MED ORDER — HYDROCODONE-ACETAMINOPHEN 5-325 MG PO TABS
1.0000 | ORAL_TABLET | Freq: Once | ORAL | Status: AC
  Administered 2020-01-26: 1 via ORAL
  Filled 2020-01-26: qty 1

## 2020-01-26 MED ORDER — KETOROLAC TROMETHAMINE 30 MG/ML IJ SOLN
10.0000 mg | Freq: Once | INTRAMUSCULAR | Status: AC
  Administered 2020-01-26: 9.9 mg via INTRAVENOUS
  Filled 2020-01-26: qty 1

## 2020-01-26 MED ORDER — HYDROCODONE-ACETAMINOPHEN 5-325 MG PO TABS: 1.0000 | ORAL_TABLET | Freq: Four times a day (QID) | ORAL | 0 refills | Status: DC | PRN

## 2020-01-26 MED ORDER — PHENAZOPYRIDINE HCL 200 MG PO TABS: 200.0000 mg | ORAL_TABLET | Freq: Three times a day (TID) | ORAL | 0 refills | Status: DC | PRN

## 2020-01-26 MED ORDER — SODIUM CHLORIDE 0.9 % IV BOLUS
1000.0000 mL | Freq: Once | INTRAVENOUS | Status: AC
  Administered 2020-01-26: 1000 mL via INTRAVENOUS
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
## 2020-01-26 NOTE — ED Notes (Signed)
Pt reports pain and history of kidney issues since childhood. States pain has been present for last week but has not improved and has worsened

## 2020-01-26 NOTE — Discharge Instructions (Signed)
1.  You may take pain medicines as needed (Motrin/Norco #15). 2.  Take Pyridium as prescribed for discomfort of urination. 3.  Return to the ER for worsening symptoms, persistent vomiting, difficulty breathing or other concerns.

## 2020-06-10 ENCOUNTER — Other Ambulatory Visit: Payer: Self-pay

## 2020-06-10 ENCOUNTER — Ambulatory Visit (LOCAL_COMMUNITY_HEALTH_CENTER): Payer: Medicaid Other

## 2020-06-10 VITALS — BP 107/61 | Ht 71.0 in | Wt 173.0 lb

## 2020-06-10 DIAGNOSIS — Z3201 Encounter for pregnancy test, result positive: Secondary | ICD-10-CM

## 2020-06-10 LAB — PREGNANCY, URINE: Preg Test, Ur: POSITIVE — AB

## 2020-06-10 MED ORDER — PRENATAL 27-0.8 MG PO TABS: 1.0000 | ORAL_TABLET | Freq: Every day | ORAL | 0 refills | Status: AC

## 2020-06-10 NOTE — Progress Notes (Signed)
UPT positive today. Plans prenatal care at [facility]. PHQ 9 today and offered consult with provider. Pt declined consult and says she goes to [facility] and has peer support counselor.Pt accepted contact card for A. Mariana Kaufman, LCSW ([facility]) and card for [employer]. Pt reports periods of dizziness and recent wt. Loss of approx. 7-10 lbs. Consult with ARobbie Lis, PA-C regarding current symptoms and prenatal hx of "low lying" placenta. Provider recommends pt to maintain good hydration and to establish prenatal care soon. RN explained recommendations to pt. Pt in agreement. Questions answered and reports understanding. To clerk for preadmit. Jerel Shepherd, RN

## 2020-06-23 ENCOUNTER — Other Ambulatory Visit: Payer: Self-pay

## 2020-06-23 ENCOUNTER — Emergency Department: Payer: Medicaid Other

## 2020-06-23 ENCOUNTER — Emergency Department
Admission: EM | Admit: 2020-06-23 | Discharge: 2020-06-23 | Disposition: A | Discharge status: Home / Self Care | Payer: Medicaid Other | Attending: Emergency Medicine | Admitting: Emergency Medicine

## 2020-06-23 ENCOUNTER — Encounter: Payer: Self-pay | Admitting: Emergency Medicine

## 2020-06-23 DIAGNOSIS — O26891 Other specified pregnancy related conditions, first trimester: Secondary | ICD-10-CM | POA: Insufficient documentation

## 2020-06-23 DIAGNOSIS — R1033 Periumbilical pain: Secondary | ICD-10-CM | POA: Diagnosis not present

## 2020-06-23 DIAGNOSIS — F17210 Nicotine dependence, cigarettes, uncomplicated: Secondary | ICD-10-CM | POA: Diagnosis not present

## 2020-06-23 DIAGNOSIS — Z3490 Encounter for supervision of normal pregnancy, unspecified, unspecified trimester: Secondary | ICD-10-CM

## 2020-06-23 DIAGNOSIS — Z3A09 9 weeks gestation of pregnancy: Secondary | ICD-10-CM | POA: Insufficient documentation

## 2020-06-23 DIAGNOSIS — R109 Unspecified abdominal pain: Secondary | ICD-10-CM

## 2020-06-23 LAB — COMPREHENSIVE METABOLIC PANEL
ALT: 19 U/L (ref 0–44)
AST: 23 U/L (ref 15–41)
Albumin: 4.4 g/dL (ref 3.5–5.0)
Alkaline Phosphatase: 56 U/L (ref 38–126)
Anion gap: 11 (ref 5–15)
BUN: 7 mg/dL (ref 6–20)
CO2: 23 mmol/L (ref 22–32)
Calcium: 9.1 mg/dL (ref 8.9–10.3)
Chloride: 102 mmol/L (ref 98–111)
Creatinine, Ser: 0.56 mg/dL (ref 0.44–1.00)
GFR, Estimated: >60 mL/min (ref >60)
Glucose, Bld: 63 mg/dL — ABNORMAL LOW (ref 70–99)
Potassium: 3.3 mmol/L — ABNORMAL LOW (ref 3.5–5.1)
Sodium: 136 mmol/L (ref 135–145)
Total Bilirubin: 0.7 mg/dL (ref 0.3–1.2)
Total Protein: 7.4 g/dL (ref 6.5–8.1)

## 2020-06-23 LAB — LIPASE, BLOOD: Lipase: 34 U/L (ref 11–51)

## 2020-06-23 LAB — HCG, QUANTITATIVE, PREGNANCY: hCG, Beta Chain, Quant, S: 103528 m[IU]/mL — ABNORMAL HIGH (ref <5)

## 2020-06-23 LAB — CBC
HCT: 36.6 % (ref 36.0–46.0)
Hemoglobin: 12.3 g/dL (ref 12.0–15.0)
MCH: 31.2 pg (ref 26.0–34.0)
MCHC: 33.6 g/dL (ref 30.0–36.0)
MCV: 92.9 fL (ref 80.0–100.0)
Platelets: 205 10*3/uL (ref 150–400)
RBC: 3.94 MIL/uL (ref 3.87–5.11)
RDW: 13 % (ref 11.5–15.5)
WBC: 6.8 10*3/uL (ref 4.0–10.5)
nRBC: 0 % (ref 0.0–0.2)

## 2020-06-23 LAB — ABO/RH: ABO/RH(D): A POS

## 2020-06-23 NOTE — ED Notes (Signed)
Pt presents to the ED for lower abdominal pain that pt states has been going on since she found out she was pregnant. Pt describes as cramping. Pt is approx [redacted] weeks pregnant. Pt states she went to the health department and told them the issues but they did not resolve anything. Pt states she did have some vaginal bleeding at the time she went to the health department but denies any now. Pt is A&Ox4 and NAD. Ambulatory to room.

## 2020-06-23 NOTE — ED Triage Notes (Signed)
Pt presents to ED via POV with c/o lower abdominal cramping. Pt states she is approx [redacted] weeks pregnant, is a G5L4. Pt states has pregnancy confirmed at health department, denies vaginal bleeding at this time, states has had the lower abdominal cramping for several weeks, since before she found out she was pregnant.
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
## 2020-07-05 ENCOUNTER — Ambulatory Visit (INDEPENDENT_AMBULATORY_CARE_PROVIDER_SITE_OTHER): Payer: Medicaid Other | Admitting: Obstetrics and Gynecology

## 2020-07-05 ENCOUNTER — Other Ambulatory Visit: Payer: Self-pay

## 2020-07-05 VITALS — BP 98/66 | HR 105 | Ht 67.0 in | Wt 173.8 lb

## 2020-07-05 DIAGNOSIS — Z0283 Encounter for blood-alcohol and blood-drug test: Secondary | ICD-10-CM | POA: Diagnosis not present

## 2020-07-05 DIAGNOSIS — Z113 Encounter for screening for infections with a predominantly sexual mode of transmission: Secondary | ICD-10-CM

## 2020-07-05 DIAGNOSIS — Z3481 Encounter for supervision of other normal pregnancy, first trimester: Secondary | ICD-10-CM | POA: Diagnosis not present

## 2020-07-05 MED ORDER — PRENATAL ADULT GUMMY/DHA/FA 0.4-25 MG PO CHEW: 25.0000 mg | CHEWABLE_TABLET | Freq: Every day | ORAL | 11 refills | Status: DC

## 2020-07-05 NOTE — Progress Notes (Signed)
      Bertis Ruddy presents for NOB nurse intake visit. Pregnancy confirmation done at [facility]06/17/2020, with Jerel Shepherd, RN.  G 7.  [phone].  LMP 04/28/2020.  EDD 01/23/2021.  Ga [redacted]w[redacted]d. Pregnancy education material explained and given.  0 cats in the home.  NOB labs ordered. BMI less than 30. TSH/HbgA1c not ordered. Sickle cell not order due to race. HIV and drug screen explained and ordered. Genetic screening discussed. Genetic testing; Unsure. Pt to discuss genetic testing with provider. PNV encouraged. Pt to follow up with provider in  3 weeks for NOB physical. Pt had ABO and Rh at [facility] on 06/23/2020.  FMLA/Consent for HIV/Drug Screening and [facility] Financial Policy all information reviewed and signed.

## 2020-07-05 NOTE — Progress Notes (Signed)
Patient chart accessed to assess plans for pregnancy following positive PT. Patient has new OB appointment today at [facility].Burt Knack, RN

## 2020-07-06 LAB — VARICELLA ZOSTER ANTIBODY, IGG: Varicella zoster IgG: 544 index (ref >165)

## 2020-07-06 LAB — URINALYSIS, ROUTINE W REFLEX MICROSCOPIC
Bilirubin, UA: NEGATIVE
Glucose, UA: NEGATIVE
Leukocytes,UA: NEGATIVE
Nitrite, UA: NEGATIVE
RBC, UA: NEGATIVE
Specific Gravity, UA: 1.027 (ref 1.005–1.030)
Urobilinogen, Ur: 1 mg/dL (ref 0.2–1.0)
pH, UA: 6 (ref 5.0–7.5)

## 2020-07-06 LAB — AB SCR+ANTIBODY ID: Antibody Screen: POSITIVE — AB

## 2020-07-06 LAB — TOXOPLASMA ANTIBODIES- IGG AND  IGM
Toxoplasma Antibody- IgM: <3 AU/mL (ref 0.0–7.9)
Toxoplasma IgG Ratio: <3 IU/mL (ref 0.0–7.1)

## 2020-07-06 LAB — ANTIBODY SCREEN

## 2020-07-06 LAB — RUBELLA SCREEN: Rubella Antibodies, IGG: 3.65 index (ref >0.99)

## 2020-07-06 LAB — HIV ANTIBODY (ROUTINE TESTING W REFLEX): HIV Screen 4th Generation wRfx: NONREACTIVE

## 2020-07-06 LAB — HEPATITIS B SURFACE ANTIGEN: Hepatitis B Surface Ag: NEGATIVE

## 2020-07-06 LAB — RPR: RPR Ser Ql: NONREACTIVE

## 2020-07-07 LAB — URINE CULTURE, OB REFLEX

## 2020-07-07 LAB — CULTURE, OB URINE

## 2020-07-08 LAB — NICOTINE SCREEN, URINE: Cotinine Ql Scrn, Ur: POSITIVE ng/mL — AB

## 2020-07-08 LAB — DRUG PROFILE, UR, 9 DRUGS (LABCORP)
Amphetamines, Urine: NEGATIVE ng/mL
Barbiturate Quant, Ur: NEGATIVE ng/mL
Benzodiazepine Quant, Ur: NEGATIVE ng/mL
Cannabinoid Quant, Ur: NEGATIVE ng/mL
Cocaine (Metab.): NEGATIVE ng/mL
Methadone Screen, Urine: NEGATIVE ng/mL
Opiate Quant, Ur: NEGATIVE ng/mL
PCP Quant, Ur: NEGATIVE ng/mL
Propoxyphene: NEGATIVE ng/mL

## 2020-07-08 LAB — GC/CHLAMYDIA PROBE AMP
Chlamydia trachomatis, NAA: NEGATIVE
Neisseria Gonorrhoeae by PCR: NEGATIVE

## 2020-07-27 ENCOUNTER — Encounter: Payer: Self-pay | Admitting: Obstetrics and Gynecology

## 2020-07-27 ENCOUNTER — Ambulatory Visit (INDEPENDENT_AMBULATORY_CARE_PROVIDER_SITE_OTHER): Payer: Medicaid Other | Admitting: Obstetrics and Gynecology

## 2020-07-27 ENCOUNTER — Other Ambulatory Visit: Payer: Self-pay

## 2020-07-27 VITALS — BP 105/63 | HR 101 | Wt 177.2 lb

## 2020-07-27 DIAGNOSIS — Z87898 Personal history of other specified conditions: Secondary | ICD-10-CM | POA: Diagnosis not present

## 2020-07-27 DIAGNOSIS — Z3481 Encounter for supervision of other normal pregnancy, first trimester: Secondary | ICD-10-CM | POA: Diagnosis not present

## 2020-07-27 DIAGNOSIS — Z8632 Personal history of gestational diabetes: Secondary | ICD-10-CM

## 2020-07-27 DIAGNOSIS — Z3A12 12 weeks gestation of pregnancy: Secondary | ICD-10-CM

## 2020-07-27 DIAGNOSIS — F1991 Other psychoactive substance use, unspecified, in remission: Secondary | ICD-10-CM

## 2020-07-27 LAB — POCT URINALYSIS DIPSTICK OB
Bilirubin, UA: NEGATIVE
Blood, UA: NEGATIVE
Glucose, UA: NEGATIVE
Ketones, UA: NEGATIVE
Leukocytes, UA: NEGATIVE
Nitrite, UA: NEGATIVE
POC,PROTEIN,UA: NEGATIVE
Spec Grav, UA: 1.01 (ref 1.010–1.025)
Urobilinogen, UA: 0.2 E.U./dL
pH, UA: 6 (ref 5.0–8.0)

## 2020-07-27 NOTE — Progress Notes (Signed)
NOB: 4 previous vaginal births at term.  History of low-lying placenta but this resolved.  History of gestational diabetes.  Considering tubal ligation for birth control.  She is currently taking prenatal vitamins. [id number] (2 first trimester miscarriages)  Physical examination General NAD, Conversant  HEENT Atraumatic; Op clear with mmm.  Normo-cephalic. Pupils reactive. Anicteric sclerae  Thyroid/Neck Smooth without nodularity or enlargement. Normal ROM.  Neck Supple.  Skin No rashes, lesions or ulceration. Normal palpated skin turgor. No nodularity.  Breasts: No masses or discharge.  Symmetric.  No axillary adenopathy.  Lungs: Clear to auscultation.No rales or wheezes. Normal Respiratory effort, no retractions.  Heart: NSR.  No murmurs or rubs appreciated. No periferal edema  Abdomen: Soft.  Non-tender.  No masses.  No HSM. No hernia  Extremities: Moves all appropriately.  Normal ROM for age. No lymphadenopathy.  Neuro: Oriented to PPT.  Normal mood. Normal affect.     Pelvic:   Vulva: Normal appearance.  No lesions.  Vagina: No lesions or abnormalities noted.  Support: Normal pelvic support.  Urethra No masses tenderness or scarring.  Meatus Normal size without lesions or prolapse.  Cervix: Normal appearance.  No lesions.  Anus: Normal exam.  No lesions.  Perineum: Normal exam.  No lesions.        Bimanual   Adnexae: No masses.  Non-tender to palpation.  Uterus: Enlarged. POS FHTs   Non-tender.  Mobile.  AV.  Adnexae: No masses.  Non-tender to palpation.  Cul-de-sac: Negative for abnormality.  Adnexae: No masses.  Non-tender to palpation.         Pelvimetry   Diagonal: Reached.  Spines: Average.  Sacrum: Concave.  Pubic Arch: Normal.   I spent 31 minutes involved in the care of this patient preparing to see the patient by obtaining and reviewing her medical history (including labs, imaging tests and prior procedures), documenting clinical information in the electronic health  record (EHR), counseling and coordinating care plans, writing and sending prescriptions, ordering tests or procedures and directly communicating with the patient by discussing pertinent items from her history and physical exam as well as detailing my assessment and plan as noted above so that she has an informed understanding.  All of her questions were answered.

## 2020-07-27 NOTE — Addendum Note (Signed)
Addended by: Dorian Pod on: 07/27/2020 02:32 PM   Modules accepted: Orders

## 2020-07-28 LAB — HEMOGLOBIN A1C
Est. average glucose Bld gHb Est-mCnc: 97 mg/dL
Hgb A1c MFr Bld: 5 % (ref 4.8–5.6)

## 2020-08-01 LAB — MATERNIT21  PLUS CORE+ESS+SCA, BLOOD
11q23 deletion (Jacobsen): NOT DETECTED
15q11 deletion (PW Angelman): NOT DETECTED
1p36 deletion syndrome: NOT DETECTED
22q11 deletion (DiGeorge): NOT DETECTED
4p16 deletion(Wolf-Hirschhorn): NOT DETECTED
5p15 deletion (Cri-du-chat): NOT DETECTED
8q24 deletion (Langer-Giedion): NOT DETECTED
Fetal Fraction: 10
Monosomy X (Turner Syndrome): NOT DETECTED
Result (T21): NEGATIVE
Trisomy 13 (Patau syndrome): NEGATIVE
Trisomy 16: NOT DETECTED
Trisomy 18 (Edwards syndrome): NEGATIVE
Trisomy 21 (Down syndrome): NEGATIVE
Trisomy 22: NOT DETECTED
XXX (Triple X Syndrome): NOT DETECTED
XXY (Klinefelter Syndrome): NOT DETECTED
XYY (Jacobs Syndrome): NOT DETECTED

## 2020-08-03 ENCOUNTER — Telehealth: Payer: Self-pay

## 2020-08-03 NOTE — Telephone Encounter (Signed)
Patient called in stating that she gives permission for her mother Margorie John DOB 6/11/196 to pick up her genetic test results.

## 2020-08-04 NOTE — Telephone Encounter (Signed)
Patients mother came to pick up her genetic test results- ID was verified.  Also called patient to inform her that a nurse never received her MyChart message and to double check that she was using the MyChart features correctly.

## 2020-08-23 NOTE — Patient Instructions (Addendum)
Second Trimester of Pregnancy  The second trimester is from week 14 through week 27 (month 4 through 6). This is often the time in pregnancy that you feel your best. Often times, morning sickness has lessened or quit. You may have more energy, and you may get hungry more often. Your unborn baby is growing rapidly. At the end of the sixth month, he or she is about 9 inches long and weighs about 1 pounds. You will likely feel the baby move between 18 and 20 weeks of pregnancy. Follow these instructions at home: Medicines  Take over-the-counter and prescription medicines only as told by your doctor. Some medicines are safe and some medicines are not safe during pregnancy.  Take a prenatal vitamin that contains at least 600 micrograms (mcg) of folic acid.  If you have trouble pooping (constipation), take medicine that will make your stool soft (stool softener) if your doctor approves. Eating and drinking   Eat regular, healthy meals.  Avoid raw meat and uncooked cheese.  If you get low calcium from the food you eat, talk to your doctor about taking a daily calcium supplement.  Avoid foods that are high in fat and sugars, such as fried and sweet foods.  If you feel sick to your stomach (nauseous) or throw up (vomit): ? Eat 4 or 5 small meals a day instead of 3 large meals. ? Try eating a few soda crackers. ? Drink liquids between meals instead of during meals.  To prevent constipation: ? Eat foods that are high in fiber, like fresh fruits and vegetables, whole grains, and beans. ? Drink enough fluids to keep your pee (urine) clear or pale yellow. Activity  Exercise only as told by your doctor. Stop exercising if you start to have cramps.  Do not exercise if it is too hot, too humid, or if you are in a place of great height (high altitude).  Avoid heavy lifting.  Wear low-heeled shoes. Sit and stand up straight.  You can continue to have sex unless your doctor tells you not  to. Relieving pain and discomfort  Wear a good support bra if your breasts are tender.  Take warm water baths (sitz baths) to soothe pain or discomfort caused by hemorrhoids. Use hemorrhoid cream if your doctor approves.  Rest with your legs raised if you have leg cramps or low back pain.  If you develop puffy, bulging veins (varicose veins) in your legs: ? Wear support hose or compression stockings as told by your doctor. ? Raise (elevate) your feet for 15 minutes, 3-4 times a day. ? Limit salt in your food. Prenatal care  Write down your questions. Take them to your prenatal visits.  Keep all your prenatal visits as told by your doctor. This is important. Safety  Wear your seat belt when driving.  Make a list of emergency phone numbers, including numbers for family, friends, the hospital, and police and fire departments. General instructions  Ask your doctor about the right foods to eat or for help finding a counselor, if you need these services.  Ask your doctor about local prenatal classes. Begin classes before month 6 of your pregnancy.  Do not use hot tubs, steam rooms, or saunas.  Do not douche or use tampons or scented sanitary pads.  Do not cross your legs for long periods of time.  Visit your dentist if you have not done so. Use a soft toothbrush to brush your teeth. Floss gently.  Avoid all smoking, herbs,   and alcohol. Avoid drugs that are not approved by your doctor.  Do not use any products that contain nicotine or tobacco, such as cigarettes and e-cigarettes. If you need help quitting, ask your doctor.  Avoid cat litter boxes and soil used by cats. These carry germs that can cause birth defects in the baby and can cause a loss of your baby (miscarriage) or stillbirth. Contact a doctor if:  You have mild cramps or pressure in your lower belly.  You have pain when you pee (urinate).  You have bad smelling fluid coming from your vagina.  You continue to  feel sick to your stomach (nauseous), throw up (vomit), or have watery poop (diarrhea).  You have a nagging pain in your belly area.  You feel dizzy. Get help right away if:  You have a fever.  You are leaking fluid from your vagina.  You have spotting or bleeding from your vagina.  You have severe belly cramping or pain.  You lose or gain weight rapidly.  You have trouble catching your breath and have chest pain.  You notice sudden or extreme puffiness (swelling) of your face, hands, ankles, feet, or legs.  You have not felt the baby move in over an hour.  You have severe headaches that do not go away when you take medicine.  You have trouble seeing. Summary  The second trimester is from week 14 through week 27 (months 4 through 6). This is often the time in pregnancy that you feel your best.  To take care of yourself and your unborn baby, you will need to eat healthy meals, take medicines only if your doctor tells you to do so, and do activities that are safe for you and your baby.  Call your doctor if you get sick or if you notice anything unusual about your pregnancy. Also, call your doctor if you need help with the right food to eat, or if you want to know what activities are safe for you. This information is not intended to replace advice given to you by your health care provider. Make sure you discuss any questions you have with your health care provider. Document Revised: 12/13/2018 Document Reviewed: 09/26/2016 Elsevier Patient Education  2020 [employer].    Breastfeeding  Choosing to breastfeed is one of the best decisions you can make for yourself and your baby. A change in hormones during pregnancy causes your breasts to make breast milk in your milk-producing glands. Hormones prevent breast milk from being released before your baby is born. They also prompt milk flow after birth. Once breastfeeding has begun, thoughts of your baby, as well as his or her  sucking or crying, can stimulate the release of milk from your milk-producing glands. Benefits of breastfeeding Research shows that breastfeeding offers many health benefits for infants and mothers. It also offers a cost-free and convenient way to feed your baby. For your baby  Your first milk (colostrum) helps your baby's digestive system to function better.  Special cells in your milk (antibodies) help your baby to fight off infections.  Breastfed babies are less likely to develop asthma, allergies, obesity, or type 2 diabetes. They are also at lower risk for sudden infant death syndrome (SIDS).  Nutrients in breast milk are better able to meet your baby's needs compared to infant formula.  Breast milk improves your baby's brain development. For you  Breastfeeding helps to create a very special bond between you and your baby.  Breastfeeding is convenient.  Breast milk costs nothing and is always available at the correct temperature.  Breastfeeding helps to burn calories. It helps you to lose the weight that you gained during pregnancy.  Breastfeeding makes your uterus return faster to its size before pregnancy. It also slows bleeding (lochia) after you give birth.  Breastfeeding helps to lower your risk of developing type 2 diabetes, osteoporosis, rheumatoid arthritis, cardiovascular disease, and breast, ovarian, uterine, and endometrial cancer later in life. Breastfeeding basics Starting breastfeeding  Find a comfortable place to sit or lie down, with your neck and back well-supported.  Place a pillow or a rolled-up blanket under your baby to bring him or her to the level of your breast (if you are seated). Nursing pillows are specially designed to help support your arms and your baby while you breastfeed.  Make sure that your baby's tummy (abdomen) is facing your abdomen.  Gently massage your breast. With your fingertips, massage from the outer edges of your breast inward toward  the nipple. This encourages milk flow. If your milk flows slowly, you may need to continue this action during the feeding.  Support your breast with 4 fingers underneath and your thumb above your nipple (make the letter "C" with your hand). Make sure your fingers are well away from your nipple and your baby's mouth.  Stroke your baby's lips gently with your finger or nipple.  When your baby's mouth is open wide enough, quickly bring your baby to your breast, placing your entire nipple and as much of the areola as possible into your baby's mouth. The areola is the colored area around your nipple. ? More areola should be visible above your baby's upper lip than below the lower lip. ? Your baby's lips should be opened and extended outward (flanged) to ensure an adequate, comfortable latch. ? Your baby's tongue should be between his or her lower gum and your breast.  Make sure that your baby's mouth is correctly positioned around your nipple (latched). Your baby's lips should create a seal on your breast and be turned out (everted).  It is common for your baby to suck about 2-3 minutes in order to start the flow of breast milk. Latching Teaching your baby how to latch onto your breast properly is very important. An improper latch can cause nipple pain, decreased milk supply, and poor weight gain in your baby. Also, if your baby is not latched onto your nipple properly, he or she may swallow some air during feeding. This can make your baby fussy. Burping your baby when you switch breasts during the feeding can help to get rid of the air. However, teaching your baby to latch on properly is still the best way to prevent fussiness from swallowing air while breastfeeding. Signs that your baby has successfully latched onto your nipple  Silent tugging or silent sucking, without causing you pain. Infant's lips should be extended outward (flanged).  Swallowing heard between every 3-4 sucks once your milk has  started to flow (after your let-down milk reflex occurs).  Muscle movement above and in front of his or her ears while sucking. Signs that your baby has not successfully latched onto your nipple  Sucking sounds or smacking sounds from your baby while breastfeeding.  Nipple pain. If you think your baby has not latched on correctly, slip your finger into the corner of your baby's mouth to break the suction and place it between your baby's gums. Attempt to start breastfeeding again. Signs of successful breastfeeding  Signs from your baby  Your baby will gradually decrease the number of sucks or will completely stop sucking.  Your baby will fall asleep.  Your baby's body will relax.  Your baby will retain a small amount of milk in his or her mouth.  Your baby will let go of your breast by himself or herself. Signs from you  Breasts that have increased in firmness, weight, and size 1-3 hours after feeding.  Breasts that are softer immediately after breastfeeding.  Increased milk volume, as well as a change in milk consistency and color by the fifth day of breastfeeding.  Nipples that are not sore, cracked, or bleeding. Signs that your baby is getting enough milk  Wetting at least 1-2 diapers during the first 24 hours after birth.  Wetting at least 5-6 diapers every 24 hours for the first week after birth. The urine should be clear or pale yellow by the age of 5 days.  Wetting 6-8 diapers every 24 hours as your baby continues to grow and develop.  At least 3 stools in a 24-hour period by the age of 5 days. The stool should be soft and yellow.  At least 3 stools in a 24-hour period by the age of 7 days. The stool should be seedy and yellow.  No loss of weight greater than 10% of birth weight during the first 3 days of life.  Average weight gain of 4-7 oz (113-198 g) per week after the age of 4 days.  Consistent daily weight gain by the age of 5 days, without weight loss after  the age of 2 weeks. After a feeding, your baby may spit up a small amount of milk. This is normal. Breastfeeding frequency and duration Frequent feeding will help you make more milk and can prevent sore nipples and extremely full breasts (breast engorgement). Breastfeed when you feel the need to reduce the fullness of your breasts or when your baby shows signs of hunger. This is called "breastfeeding on demand." Signs that your baby is hungry include:  Increased alertness, activity, or restlessness.  Movement of the head from side to side.  Opening of the mouth when the corner of the mouth or cheek is stroked (rooting).  Increased sucking sounds, smacking lips, cooing, sighing, or squeaking.  Hand-to-mouth movements and sucking on fingers or hands.  Fussing or crying. Avoid introducing a pacifier to your baby in the first 4-6 weeks after your baby is born. After this time, you may choose to use a pacifier. Research has shown that pacifier use during the first year of a baby's life decreases the risk of sudden infant death syndrome (SIDS). Allow your baby to feed on each breast as long as he or she wants. When your baby unlatches or falls asleep while feeding from the first breast, offer the second breast. Because newborns are often sleepy in the first few weeks of life, you may need to awaken your baby to get him or her to feed. Breastfeeding times will vary from baby to baby. However, the following rules can serve as a guide to help you make sure that your baby is properly fed:  Newborns (babies 20 weeks of age or younger) may breastfeed every 1-3 hours.  Newborns should not go without breastfeeding for longer than 3 hours during the day or 5 hours during the night.  You should breastfeed your baby a minimum of 8 times in a 24-hour period. Breast milk pumping     Pumping and  storing breast milk allows you to make sure that your baby is exclusively fed your breast milk, even at times when  you are unable to breastfeed. This is especially important if you go back to work while you are still breastfeeding, or if you are not able to be present during feedings. Your lactation consultant can help you find a method of pumping that works best for you and give you guidelines about how long it is safe to store breast milk. Caring for your breasts while you breastfeed Nipples can become dry, cracked, and sore while breastfeeding. The following recommendations can help keep your breasts moisturized and healthy:  Avoid using soap on your nipples.  Wear a supportive bra designed especially for nursing. Avoid wearing underwire-style bras or extremely tight bras (sports bras).  Air-dry your nipples for 3-4 minutes after each feeding.  Use only cotton bra pads to absorb leaked breast milk. Leaking of breast milk between feedings is normal.  Use lanolin on your nipples after breastfeeding. Lanolin helps to maintain your skin's normal moisture barrier. Pure lanolin is not harmful (not toxic) to your baby. You may also hand express a few drops of breast milk and gently massage that milk into your nipples and allow the milk to air-dry. In the first few weeks after giving birth, some women experience breast engorgement. Engorgement can make your breasts feel heavy, warm, and tender to the touch. Engorgement peaks within 3-5 days after you give birth. The following recommendations can help to ease engorgement:  Completely empty your breasts while breastfeeding or pumping. You may want to start by applying warm, moist heat (in the shower or with warm, water-soaked hand towels) just before feeding or pumping. This increases circulation and helps the milk flow. If your baby does not completely empty your breasts while breastfeeding, pump any extra milk after he or she is finished.  Apply ice packs to your breasts immediately after breastfeeding or pumping, unless this is too uncomfortable for you. To do  this: ? Put ice in a plastic bag. ? Place a towel between your skin and the bag. ? Leave the ice on for 20 minutes, 2-3 times a day.  Make sure that your baby is latched on and positioned properly while breastfeeding. If engorgement persists after 48 hours of following these recommendations, contact your health care provider or a Advertising copywriterlactation consultant. Overall health care recommendations while breastfeeding  Eat 3 healthy meals and 3 snacks every day. Well-nourished mothers who are breastfeeding need an additional 450-500 calories a day. You can meet this requirement by increasing the amount of a balanced diet that you eat.  Drink enough water to keep your urine pale yellow or clear.  Rest often, relax, and continue to take your prenatal vitamins to prevent fatigue, stress, and low vitamin and mineral levels in your body (nutrient deficiencies).  Do not use any products that contain nicotine or tobacco, such as cigarettes and e-cigarettes. Your baby may be harmed by chemicals from cigarettes that pass into breast milk and exposure to secondhand smoke. If you need help quitting, ask your health care provider.  Avoid alcohol.  Do not use illegal drugs or marijuana.  Talk with your health care provider before taking any medicines. These include over-the-counter and prescription medicines as well as vitamins and herbal supplements. Some medicines that may be harmful to your baby can pass through breast milk.  It is possible to become pregnant while breastfeeding. If birth control is desired, ask your health  care provider about options that will be safe while breastfeeding your baby. Where to find more information: [employer]: www.llli.org Contact a health care provider if:  You feel like you want to stop breastfeeding or have become frustrated with breastfeeding.  Your nipples are cracked or bleeding.  Your breasts are red, tender, or warm.  You have: ? Painful  breasts or nipples. ? A swollen area on either breast. ? A fever or chills. ? Nausea or vomiting. ? Drainage other than breast milk from your nipples.  Your breasts do not become full before feedings by the fifth day after you give birth.  You feel sad and depressed.  Your baby is: ? Too sleepy to eat well. ? Having trouble sleeping. ? More than 22 week old and wetting fewer than 6 diapers in a 24-hour period. ? Not gaining weight by 11 days of age.  Your baby has fewer than 3 stools in a 24-hour period.  Your baby's skin or the white parts of his or her eyes become yellow. Get help right away if:  Your baby is overly tired (lethargic) and does not want to wake up and feed.  Your baby develops an unexplained fever. Summary  Breastfeeding offers many health benefits for infant and mothers.  Try to breastfeed your infant when he or she shows early signs of hunger.  Gently tickle or stroke your baby's lips with your finger or nipple to allow the baby to open his or her mouth. Bring the baby to your breast. Make sure that much of the areola is in your baby's mouth. Offer one side and burp the baby before you offer the other side.  Talk with your health care provider or lactation consultant if you have questions or you face problems as you breastfeed. This information is not intended to replace advice given to you by your health care provider. Make sure you discuss any questions you have with your health care provider. Document Revised: 11/15/2017 Document Reviewed: 09/22/2016 Elsevier Patient Education  2020 [employer].    Steps to Quit Smoking Smoking tobacco is the leading cause of preventable death. It can affect almost every organ in the body. Smoking puts you and people around you at risk for many serious, long-lasting (chronic) diseases. Quitting smoking can be hard, but it is one of the best things that you can do for your health. It is never too late to quit. How do I  get ready to quit? When you decide to quit smoking, make a plan to help you succeed. Before you quit:  Pick a date to quit. Set a date within the next 2 weeks to give you time to prepare.  Write down the reasons why you are quitting. Keep this list in places where you will see it often.  Tell your family, friends, and co-workers that you are quitting. Their support is important.  Talk with your doctor about the choices that may help you quit.  Find out if your health insurance will pay for these treatments.  Know the people, places, things, and activities that make you want to smoke (triggers). Avoid them. What first steps can I take to quit smoking?  Throw away all cigarettes at home, at work, and in your car.  Throw away the things that you use when you smoke, such as ashtrays and lighters.  Clean your car. Make sure to empty the ashtray.  Clean your home, including curtains and carpets. What can I do  to help me quit smoking? Talk with your doctor about taking medicines and seeing a counselor at the same time. You are more likely to succeed when you do both.  If you are pregnant or breastfeeding, talk with your doctor about counseling or other ways to quit smoking. Do not take medicine to help you quit smoking unless your doctor tells you to do so. To quit smoking: Quit right away  Quit smoking totally, instead of slowly cutting back on how much you smoke over a period of time.  Go to counseling. You are more likely to quit if you go to counseling sessions regularly. Take medicine You may take medicines to help you quit. Some medicines need a prescription, and some you can buy over-the-counter. Some medicines may contain a drug called nicotine to replace the nicotine in cigarettes. Medicines may:  Help you to stop having the desire to smoke (cravings).  Help to stop the problems that come when you stop smoking (withdrawal symptoms). Your doctor may ask you to use:  Nicotine  patches, gum, or lozenges.  Nicotine inhalers or sprays.  Non-nicotine medicine that is taken by mouth. Find resources Find resources and other ways to help you quit smoking and remain smoke-free after you quit. These resources are most helpful when you use them often. They include:  Online chats with a Veterinary surgeon.  Phone quitlines.  Printed Materials engineer.  Support groups or group counseling.  Text messaging programs.  Mobile phone apps. Use apps on your mobile phone or tablet that can help you stick to your quit plan. There are many free apps for mobile phones and tablets as well as websites. Examples include Quit Guide from the [employer] and smokefree.gov  What things can I do to make it easier to quit?   Talk to your family and friends. Ask them to support and encourage you.  Call a phone quitline (1-800-QUIT-NOW), reach out to support groups, or work with a Veterinary surgeon.  Ask people who smoke to not smoke around you.  Avoid places that make you want to smoke, such as: ? Bars. ? Parties. ? Smoke-break areas at work.  Spend time with people who do not smoke.  Lower the stress in your life. Stress can make you want to smoke. Try these things to help your stress: ? Getting regular exercise. ? Doing deep-breathing exercises. ? Doing yoga. ? Meditating. ? Doing a body scan. To do this, close your eyes, focus on one area of your body at a time from head to toe. Notice which parts of your body are tense. Try to relax the muscles in those areas. How will I feel when I quit smoking? Day 1 to 3 weeks Within the first 24 hours, you may start to have some problems that come from quitting tobacco. These problems are very bad 2-3 days after you quit, but they do not often last for more than 2-3 weeks. You may get these symptoms:  Mood swings.  Feeling restless, nervous, angry, or annoyed.  Trouble concentrating.  Dizziness.  Strong desire for high-sugar foods and  nicotine.  Weight gain.  Trouble pooping (constipation).  Feeling like you may vomit (nausea).  Coughing or a sore throat.  Changes in how the medicines that you take for other issues work in your body.  Depression.  Trouble sleeping (insomnia). Week 3 and afterward After the first 2-3 weeks of quitting, you may start to notice more positive results, such as:  Better sense of smell and  taste.  Less coughing and sore throat.  Slower heart rate.  Lower blood pressure.  Clearer skin.  Better breathing.  Fewer sick days. Quitting smoking can be hard. Do not give up if you fail the first time. Some people need to try a few times before they succeed. Do your best to stick to your quit plan, and talk with your doctor if you have any questions or concerns. Summary  Smoking tobacco is the leading cause of preventable death. Quitting smoking can be hard, but it is one of the best things that you can do for your health.  When you decide to quit smoking, make a plan to help you succeed.  Quit smoking right away, not slowly over a period of time.  When you start quitting, seek help from your doctor, family, or friends. This information is not intended to replace advice given to you by your health care provider. Make sure you discuss any questions you have with your health care provider. Document Revised: 05/16/2019 Document Reviewed: 11/09/2018 Elsevier Patient Education  2020 [employer].

## 2020-08-23 NOTE — Progress Notes (Addendum)
ROB-Pt present for routine prenatal care. Pt c/o of abd cramping that is relieved by gas x. Pt requested to have flu vaccine at next visit. PHQ-9= 18  GAD-7=14.

## 2020-08-24 ENCOUNTER — Encounter: Payer: Medicaid Other | Admitting: Obstetrics and Gynecology

## 2020-08-24 ENCOUNTER — Ambulatory Visit (INDEPENDENT_AMBULATORY_CARE_PROVIDER_SITE_OTHER): Payer: Medicaid Other | Admitting: Obstetrics and Gynecology

## 2020-08-24 ENCOUNTER — Encounter: Payer: Self-pay | Admitting: Obstetrics and Gynecology

## 2020-08-24 ENCOUNTER — Other Ambulatory Visit: Payer: Self-pay

## 2020-08-24 VITALS — BP 110/72 | HR 97 | Wt 179.5 lb

## 2020-08-24 DIAGNOSIS — Z8719 Personal history of other diseases of the digestive system: Secondary | ICD-10-CM | POA: Insufficient documentation

## 2020-08-24 DIAGNOSIS — Z8632 Personal history of gestational diabetes: Secondary | ICD-10-CM

## 2020-08-24 DIAGNOSIS — Z3A16 16 weeks gestation of pregnancy: Secondary | ICD-10-CM

## 2020-08-24 DIAGNOSIS — Z3482 Encounter for supervision of other normal pregnancy, second trimester: Secondary | ICD-10-CM

## 2020-08-24 DIAGNOSIS — Z716 Tobacco abuse counseling: Secondary | ICD-10-CM

## 2020-08-24 DIAGNOSIS — Z8659 Personal history of other mental and behavioral disorders: Secondary | ICD-10-CM

## 2020-08-24 LAB — POCT URINALYSIS DIPSTICK OB
Bilirubin, UA: NEGATIVE
Blood, UA: NEGATIVE
Glucose, UA: NEGATIVE
Ketones, UA: NEGATIVE
Leukocytes, UA: NEGATIVE
Nitrite, UA: NEGATIVE
POC,PROTEIN,UA: NEGATIVE
Spec Grav, UA: 1.01 (ref 1.010–1.025)
Urobilinogen, UA: 0.2 E.U./dL
pH, UA: 7.5 (ref 5.0–8.0)

## 2020-08-24 NOTE — Progress Notes (Signed)
ROB: Notes abdominal cramping, often helped with Gas-X. Does have a prior h/o IBS, was taking Miralax prior to pregnancy but notes it didn't really help much. Desires to hold off on flu vaccine until next visit. H/o GDM in a prior pregnancy, diet controlled. A1c this pregnancy normal.  Notes h/o irritability with this pregnancy. Has a remote h/o anxiety and depression, on no meds x 2-3 years. Baseline PHQ-9 and Gad-7 done today. Discussed breastfeeding, notes issues with breastfeeding in the past, has seen a lactation specialist with 2 of her pregnancies. Discussed smoking cessation, patient desires to quit, plans to quit cold [place] after finishing last pack. Discussed other options for aiding cessation (counseling, medications, gum/patch, hypnosis). Normal [id number], will considerfor AFP next visit. RTC in 4 weeks, for anatomy scan at that time.

## 2020-09-04 NOTE — L&D Delivery Note (Signed)
       Delivery Note   Nicole Kramer is a 25 y.o. [id number] at [redacted]w[redacted]d Estimated Date of Delivery: 01/23/21  PRE-OPERATIVE DIAGNOSIS:  1) [redacted]w[redacted]d pregnancy.  2)  post dates induction  POST-OPERATIVE DIAGNOSIS:  1) [redacted]w[redacted]d pregnancy s/p Vaginal, Spontaneous  2)  Viable female infant  Delivery Type: Vaginal, Spontaneous    Delivery Anesthesia: Other   Labor Complications:      ESTIMATED BLOOD LOSS: 175  ml    FINDINGS:   1) female infant, Apgar scores of    at 1 minute and    at 5 minutes and a birthweight of   ounces.    2) Nuchal cord: none  SPECIMENS:   PLACENTA:   Appearance: Intact    Removal: Spontaneous      Disposition:    DISPOSITION:  Infant to left in stable condition in the delivery room, with L&D personnel and mother,  NARRATIVE SUMMARY: Labor course:  [name] is a [id number] at [redacted]w[redacted]d who presented for post-dates induction.  She was given a single dose of misoprostol.  AROM was performed - clear fluid. She progressed well in labor without pitocin.  She received the appropriate anesthesia and proceeded to complete dilation. She did well and informed the L&D nurse as the head delivered. She went on to complete the delivery of a viable female infant. The placenta delivered without problems and was noted to be complete. A perineal and vaginal examination was performed. Episiotomy/Lacerations: None    Elonda Husky, M.D. 01/27/2021 9:20 AM

## 2020-09-21 ENCOUNTER — Other Ambulatory Visit: Payer: Medicaid Other

## 2020-09-21 ENCOUNTER — Encounter: Payer: Medicaid Other | Admitting: Obstetrics and Gynecology

## 2020-09-28 ENCOUNTER — Ambulatory Visit (INDEPENDENT_AMBULATORY_CARE_PROVIDER_SITE_OTHER): Payer: Medicaid Other | Admitting: Obstetrics and Gynecology

## 2020-09-28 ENCOUNTER — Encounter: Payer: Self-pay | Admitting: Obstetrics and Gynecology

## 2020-09-28 ENCOUNTER — Ambulatory Visit (INDEPENDENT_AMBULATORY_CARE_PROVIDER_SITE_OTHER): Payer: Medicaid Other

## 2020-09-28 ENCOUNTER — Other Ambulatory Visit: Payer: Self-pay

## 2020-09-28 VITALS — BP 110/74 | HR 96 | Wt 183.5 lb

## 2020-09-28 DIAGNOSIS — O26842 Uterine size-date discrepancy, second trimester: Secondary | ICD-10-CM

## 2020-09-28 DIAGNOSIS — Z3482 Encounter for supervision of other normal pregnancy, second trimester: Secondary | ICD-10-CM

## 2020-09-28 DIAGNOSIS — Z3A21 21 weeks gestation of pregnancy: Secondary | ICD-10-CM

## 2020-09-28 LAB — POCT URINALYSIS DIPSTICK OB
Bilirubin, UA: NEGATIVE
Glucose, UA: NEGATIVE
Ketones, UA: NEGATIVE
Leukocytes, UA: NEGATIVE
Nitrite, UA: NEGATIVE
Odor: NEGATIVE
POC,PROTEIN,UA: NEGATIVE
Spec Grav, UA: 1.01 (ref 1.010–1.025)
Urobilinogen, UA: 0.2 E.U./dL
pH, UA: 6.5 (ref 5.0–8.0)

## 2020-09-28 NOTE — Progress Notes (Signed)
-  ROB- declines flu vaccine. Back pain. AFP today.

## 2020-09-28 NOTE — Progress Notes (Signed)
ROB

## 2020-09-28 NOTE — Progress Notes (Signed)
ROB: Patient had ultrasound today.  Some growth discrepancy noted.  Plan follow-up ultrasound at 28 weeks for growth.  Patient desires AFP today.  Reports daily fetal movement.  Taking vitamins as directed.

## 2020-09-30 LAB — AFP, SERUM, OPEN SPINA BIFIDA
AFP MoM: 1.83
AFP Value: 119 ng/mL
Gest. Age on Collection Date: 21.9 weeks
Maternal Age At EDD: 27.3 yr
OSBR Risk 1 IN: 1190
Test Results:: NEGATIVE
Weight: 183 [lb_av]

## 2020-10-26 ENCOUNTER — Encounter: Payer: Medicaid Other | Admitting: Obstetrics and Gynecology

## 2020-10-26 ENCOUNTER — Other Ambulatory Visit: Payer: Self-pay

## 2020-10-26 ENCOUNTER — Encounter: Payer: Self-pay | Admitting: Obstetrics and Gynecology

## 2020-10-26 ENCOUNTER — Ambulatory Visit (INDEPENDENT_AMBULATORY_CARE_PROVIDER_SITE_OTHER): Payer: Medicaid Other | Admitting: Obstetrics and Gynecology

## 2020-10-26 VITALS — BP 94/62 | HR 96 | Wt 185.2 lb

## 2020-10-26 DIAGNOSIS — Z3483 Encounter for supervision of other normal pregnancy, third trimester: Secondary | ICD-10-CM

## 2020-10-26 DIAGNOSIS — R822 Biliuria: Secondary | ICD-10-CM

## 2020-10-26 DIAGNOSIS — Z3A27 27 weeks gestation of pregnancy: Secondary | ICD-10-CM

## 2020-10-26 DIAGNOSIS — O26892 Other specified pregnancy related conditions, second trimester: Secondary | ICD-10-CM

## 2020-10-26 DIAGNOSIS — O26842 Uterine size-date discrepancy, second trimester: Secondary | ICD-10-CM

## 2020-10-26 DIAGNOSIS — R102 Pelvic and perineal pain: Secondary | ICD-10-CM

## 2020-10-26 DIAGNOSIS — Z3A29 29 weeks gestation of pregnancy: Secondary | ICD-10-CM

## 2020-10-26 LAB — POCT URINALYSIS DIPSTICK OB
Appearance: NORMAL
Bilirubin, UA: POSITIVE
Blood, UA: NEGATIVE
Glucose, UA: NEGATIVE
Ketones, UA: NEGATIVE
Leukocytes, UA: NEGATIVE
Nitrite, UA: NEGATIVE
Odor: NORMAL
Spec Grav, UA: 1.025 (ref 1.010–1.025)
Urobilinogen, UA: 0.2 E.U./dL
pH, UA: 6 (ref 5.0–8.0)

## 2020-10-26 NOTE — Addendum Note (Signed)
Addended by: Fabian November on: 10/26/2020 11:29 PM   Modules accepted: Orders

## 2020-10-26 NOTE — Progress Notes (Signed)
ROB: [redacted]W[redacted]D: Patient is having pressure and she is not sure if it coming from baby or bleeding hemorrhoids. She has had hemorrhoids in the past but no bleeding and she has some discomfort. She also has been having some nausea but no vomiting.

## 2020-10-26 NOTE — Patient Instructions (Addendum)
Breastfeeding  Choosing to breastfeed is one of the best decisions you can make for yourself and your baby. A change in hormones during pregnancy causes your breasts to make breast milk in your milk-producing glands. Hormones prevent breast milk from being released before your baby is born. They also prompt milk flow after birth. Once breastfeeding has begun, thoughts of your baby, as well as his or her sucking or crying, can stimulate the release of milk from your milk-producing glands. Benefits of breastfeeding Research shows that breastfeeding offers many health benefits for infants and mothers. It also offers a cost-free and convenient way to feed your baby. For your baby  Your first milk (colostrum) helps your baby's digestive system to function better.  Special cells in your milk (antibodies) help your baby to fight off infections.  Breastfed babies are less likely to develop asthma, allergies, obesity, or type 2 diabetes. They are also at lower risk for sudden infant death syndrome (SIDS).  Nutrients in breast milk are better able to meet your baby's needs compared to infant formula.  Breast milk improves your baby's brain development. For you  Breastfeeding helps to create a very special bond between you and your baby.  Breastfeeding is convenient. Breast milk costs nothing and is always available at the correct temperature.  Breastfeeding helps to burn calories. It helps you to lose the weight that you gained during pregnancy.  Breastfeeding makes your uterus return faster to its size before pregnancy. It also slows bleeding (lochia) after you give birth.  Breastfeeding helps to lower your risk of developing type 2 diabetes, osteoporosis, rheumatoid arthritis, cardiovascular disease, and breast, ovarian, uterine, and endometrial cancer later in life. Breastfeeding basics Starting breastfeeding  Find a comfortable place to sit or lie down, with your neck and back  well-supported.  Place a pillow or a rolled-up blanket under your baby to bring him or her to the level of your breast (if you are seated). Nursing pillows are specially designed to help support your arms and your baby while you breastfeed.  Make sure that your baby's tummy (abdomen) is facing your abdomen.  Gently massage your breast. With your fingertips, massage from the outer edges of your breast inward toward the nipple. This encourages milk flow. If your milk flows slowly, you may need to continue this action during the feeding.  Support your breast with 4 fingers underneath and your thumb above your nipple (make the letter "C" with your hand). Make sure your fingers are well away from your nipple and your baby's mouth.  Stroke your baby's lips gently with your finger or nipple.  When your baby's mouth is open wide enough, quickly bring your baby to your breast, placing your entire nipple and as much of the areola as possible into your baby's mouth. The areola is the colored area around your nipple. ? More areola should be visible above your baby's upper lip than below the lower lip. ? Your baby's lips should be opened and extended outward (flanged) to ensure an adequate, comfortable latch. ? Your baby's tongue should be between his or her lower gum and your breast.  Make sure that your baby's mouth is correctly positioned around your nipple (latched). Your baby's lips should create a seal on your breast and be turned out (everted).  It is common for your baby to suck about 2-3 minutes in order to start the flow of breast milk. Latching Teaching your baby how to latch onto your breast properly is  very important. An improper latch can cause nipple pain, decreased milk supply, and poor weight gain in your baby. Also, if your baby is not latched onto your nipple properly, he or she may swallow some air during feeding. This can make your baby fussy. Burping your baby when you switch breasts  during the feeding can help to get rid of the air. However, teaching your baby to latch on properly is still the best way to prevent fussiness from swallowing air while breastfeeding. Signs that your baby has successfully latched onto your nipple  Silent tugging or silent sucking, without causing you pain. Infant's lips should be extended outward (flanged).  Swallowing heard between every 3-4 sucks once your milk has started to flow (after your let-down milk reflex occurs).  Muscle movement above and in front of his or her ears while sucking. Signs that your baby has not successfully latched onto your nipple  Sucking sounds or smacking sounds from your baby while breastfeeding.  Nipple pain. If you think your baby has not latched on correctly, slip your finger into the corner of your baby's mouth to break the suction and place it between your baby's gums. Attempt to start breastfeeding again. Signs of successful breastfeeding Signs from your baby  Your baby will gradually decrease the number of sucks or will completely stop sucking.  Your baby will fall asleep.  Your baby's body will relax.  Your baby will retain a small amount of milk in his or her mouth.  Your baby will let go of your breast by himself or herself. Signs from you  Breasts that have increased in firmness, weight, and size 1-3 hours after feeding.  Breasts that are softer immediately after breastfeeding.  Increased milk volume, as well as a change in milk consistency and color by the fifth day of breastfeeding.  Nipples that are not sore, cracked, or bleeding. Signs that your baby is getting enough milk  Wetting at least 1-2 diapers during the first 24 hours after birth.  Wetting at least 5-6 diapers every 24 hours for the first week after birth. The urine should be clear or pale yellow by the age of 5 days.  Wetting 6-8 diapers every 24 hours as your baby continues to grow and develop.  At least 3 stools in  a 24-hour period by the age of 5 days. The stool should be soft and yellow.  At least 3 stools in a 24-hour period by the age of 7 days. The stool should be seedy and yellow.  No loss of weight greater than 10% of birth weight during the first 3 days of life.  Average weight gain of 4-7 oz (113-198 g) per week after the age of 4 days.  Consistent daily weight gain by the age of 5 days, without weight loss after the age of 2 weeks. After a feeding, your baby may spit up a small amount of milk. This is normal. Breastfeeding frequency and duration Frequent feeding will help you make more milk and can prevent sore nipples and extremely full breasts (breast engorgement). Breastfeed when you feel the need to reduce the fullness of your breasts or when your baby shows signs of hunger. This is called "breastfeeding on demand." Signs that your baby is hungry include:  Increased alertness, activity, or restlessness.  Movement of the head from side to side.  Opening of the mouth when the corner of the mouth or cheek is stroked (rooting).  Increased sucking sounds, smacking lips, cooing,   sighing, or squeaking.  Hand-to-mouth movements and sucking on fingers or hands.  Fussing or crying. Avoid introducing a pacifier to your baby in the first 4-6 weeks after your baby is born. After this time, you may choose to use a pacifier. Research has shown that pacifier use during the first year of a baby's life decreases the risk of sudden infant death syndrome (SIDS). Allow your baby to feed on each breast as long as he or she wants. When your baby unlatches or falls asleep while feeding from the first breast, offer the second breast. Because newborns are often sleepy in the first few weeks of life, you may need to awaken your baby to get him or her to feed. Breastfeeding times will vary from baby to baby. However, the following rules can serve as a guide to help you make sure that your baby is properly  fed:  Newborns (babies 9 weeks of age or younger) may breastfeed every 1-3 hours.  Newborns should not go without breastfeeding for longer than 3 hours during the day or 5 hours during the night.  You should breastfeed your baby a minimum of 8 times in a 24-hour period. Breast milk pumping Pumping and storing breast milk allows you to make sure that your baby is exclusively fed your breast milk, even at times when you are unable to breastfeed. This is especially important if you go back to work while you are still breastfeeding, or if you are not able to be present during feedings. Your lactation consultant can help you find a method of pumping that works best for you and give you guidelines about how long it is safe to store breast milk.      Caring for your breasts while you breastfeed Nipples can become dry, cracked, and sore while breastfeeding. The following recommendations can help keep your breasts moisturized and healthy:  Avoid using soap on your nipples.  Wear a supportive bra designed especially for nursing. Avoid wearing underwire-style bras or extremely tight bras (sports bras).  Air-dry your nipples for 3-4 minutes after each feeding.  Use only cotton bra pads to absorb leaked breast milk. Leaking of breast milk between feedings is normal.  Use lanolin on your nipples after breastfeeding. Lanolin helps to maintain your skin's normal moisture barrier. Pure lanolin is not harmful (not toxic) to your baby. You may also hand express a few drops of breast milk and gently massage that milk into your nipples and allow the milk to air-dry. In the first few weeks after giving birth, some women experience breast engorgement. Engorgement can make your breasts feel heavy, warm, and tender to the touch. Engorgement peaks within 3-5 days after you give birth. The following recommendations can help to ease engorgement:  Completely empty your breasts while breastfeeding or pumping. You may  want to start by applying warm, moist heat (in the shower or with warm, water-soaked hand towels) just before feeding or pumping. This increases circulation and helps the milk flow. If your baby does not completely empty your breasts while breastfeeding, pump any extra milk after he or she is finished.  Apply ice packs to your breasts immediately after breastfeeding or pumping, unless this is too uncomfortable for you. To do this: ? Put ice in a plastic bag. ? Place a towel between your skin and the bag. ? Leave the ice on for 20 minutes, 2-3 times a day.  Make sure that your baby is latched on and positioned properly while breastfeeding.  If engorgement persists after 48 hours of following these recommendations, contact your health care provider or a Advertising [employer]writer. Overall health care recommendations while breastfeeding  Eat 3 healthy meals and 3 snacks every day. Well-nourished mothers who are breastfeeding need an additional 450-500 calories a day. You can meet this requirement by increasing the amount of a balanced diet that you eat.  Drink enough water to keep your urine pale yellow or clear.  Rest often, relax, and continue to take your prenatal vitamins to prevent fatigue, stress, and low vitamin and mineral levels in your body (nutrient deficiencies).  Do not use any products that contain nicotine or tobacco, such as cigarettes and e-cigarettes. Your baby may be harmed by chemicals from cigarettes that pass into breast milk and exposure to secondhand smoke. If you need help quitting, ask your health care provider.  Avoid alcohol.  Do not use illegal drugs or marijuana.  Talk with your health care provider before taking any medicines. These include over-the-counter and prescription medicines as well as vitamins and herbal supplements. Some medicines that may be harmful to your baby can pass through breast milk.  It is possible to become pregnant while breastfeeding. If birth  control is desired, ask your health care provider about options that will be safe while breastfeeding your baby. Where to find more information: [employer]: www.llli.org Contact a health care provider if:  You feel like you want to stop breastfeeding or have become frustrated with breastfeeding.  Your nipples are cracked or bleeding.  Your breasts are red, tender, or warm.  You have: ? Painful breasts or nipples. ? A swollen area on either breast. ? A fever or chills. ? Nausea or vomiting. ? Drainage other than breast milk from your nipples.  Your breasts do not become full before feedings by the fifth day after you give birth.  You feel sad and depressed.  Your baby is: ? Too sleepy to eat well. ? Having trouble sleeping. ? More than 25 week old and wetting fewer than 6 diapers in a 24-hour period. ? Not gaining weight by 44 days of age.  Your baby has fewer than 3 stools in a 24-hour period.  Your baby's skin or the white parts of his or her eyes become yellow. Get help right away if:  Your baby is overly tired (lethargic) and does not want to wake up and feed.  Your baby develops an unexplained fever. Summary  Breastfeeding offers many health benefits for infant and mothers.  Try to breastfeed your infant when he or she shows early signs of hunger.  Gently tickle or stroke your baby's lips with your finger or nipple to allow the baby to open his or her mouth. Bring the baby to your breast. Make sure that much of the areola is in your baby's mouth. Offer one side and burp the baby before you offer the other side.  Talk with your health care provider or lactation consultant if you have questions or you face problems as you breastfeed. This information is not intended to replace advice given to you by your health care provider. Make sure you discuss any questions you have with your health care provider. Document Revised: 11/15/2017 Document Reviewed:  09/22/2016 Elsevier Patient Education  2021 [employer].    Third Trimester of Pregnancy  The third trimester of pregnancy is from week 28 through week 40. This is also called months 7 through 9. This trimester is when your unborn baby (  fetus) is growing very fast. At the end of the ninth month, the unborn baby is about 20 inches long. It weighs about 6-10 pounds. Body changes during your third trimester Your body continues to go through many changes during this time. The changes vary and generally return to normal after the baby is born. Physical changes  Your weight will continue to increase. You may gain 25-35 pounds (11-16 kg) by the end of the pregnancy. If you are underweight, you may gain 28-40 lb (about 13-18 kg). If you are overweight, you may gain 15-25 lb (about 7-11 kg).  You may start to get stretch marks on your hips, belly (abdomen), and breasts.  Your breasts will continue to grow and may hurt. A yellow fluid (colostrum) may leak from your breasts. This is the first milk you are making for your baby.  You may have changes in your hair.  Your belly button may stick out.  You may have more swelling in your hands, face, or ankles. Health changes  You may have heartburn.  You may have trouble pooping (constipation).  You may get hemorrhoids. These are swollen veins in the butt that can itch or get painful.  You may have swollen veins (varicose veins) in your legs.  You may have more body aches in the pelvis, back, or thighs.  You may have more tingling or numbness in your hands, arms, and legs. The skin on your belly may also feel numb.  You may feel short of breath as your womb (uterus) gets bigger. Other changes  You may pee (urinate) more often.  You may have more problems sleeping.  You may notice the unborn baby "dropping," or moving lower in your belly.  You may have more discharge coming from your vagina.  Your joints may feel loose, and you may  have pain around your pelvic bone. Follow these instructions at home: Medicines  Take over-the-counter and prescription medicines only as told by your doctor. Some medicines are not safe during pregnancy.  Take a prenatal vitamin that contains at least 600 micrograms (mcg) of folic acid. Eating and drinking  Eat healthy meals that include: ? Fresh fruits and vegetables. ? Whole grains. ? Good sources of protein, such as meat, eggs, or tofu. ? Low-fat dairy products.  Avoid raw meat and unpasteurized juice, milk, and cheese. These carry germs that can harm you and your baby.  Eat 4 or 5 small meals rather than 3 large meals a day.  You may need to take these actions to prevent or treat trouble pooping: ? Drink enough fluids to keep your pee (urine) pale yellow. ? Eat foods that are high in fiber. These include beans, whole grains, and fresh fruits and vegetables. ? Limit foods that are high in fat and sugar. These include fried or sweet foods. Activity  Exercise only as told by your doctor. Stop exercising if you start to have cramps in your womb.  Avoid heavy lifting.  Do not exercise if it is too hot or too humid, or if you are in a place of great height (high altitude).  If you choose to, you may have sex unless your doctor tells you not to. Relieving pain and discomfort  Take breaks often, and rest with your legs raised (elevated) if you have leg cramps or low back pain.  Take warm water baths (sitz baths) to soothe pain or discomfort caused by hemorrhoids. Use hemorrhoid cream if your doctor approves.  Wear a good  support bra if your breasts are tender.  If you develop bulging, swollen veins in your legs: ? Wear support hose as told by your doctor. ? Raise your feet for 15 minutes, 3-4 times a day. ? Limit salt in your food. Safety  Talk to your doctor before traveling far distances.  Do not use hot tubs, steam rooms, or saunas.  Wear your seat belt at all times  when you are in a car.  Talk with your doctor if someone is hurting you or yelling at you a lot. Preparing for your baby's arrival To prepare for the arrival of your baby:  Take prenatal classes.  Visit the hospital and tour the maternity area.  Buy a rear-facing car seat. Learn how to install it in your car.  Prepare the baby's room. Take out all pillows and stuffed animals from the baby's crib. General instructions  Avoid cat litter boxes and soil used by cats. These carry germs that can cause harm to the baby and can cause a loss of your baby by miscarriage or stillbirth.  Do not douche or use tampons. Do not use scented sanitary pads.  Do not smoke or use any products that contain nicotine or tobacco. If you need help quitting, ask your doctor.  Do not drink alcohol.  Do not use herbal medicines, illegal drugs, or medicines that were not approved by your doctor. Chemicals in these products can affect your baby.  Keep all follow-up visits. This is important. Where to find more information  American Pregnancy Association: americanpregnancy.org  [employer]: www.acog.org  Office on Women's Health: MightyReward.co.nz Contact a doctor if:  You have a fever.  You have mild cramps or pressure in your lower belly.  You have a nagging pain in your belly area.  You vomit, or you have watery poop (diarrhea).  You have bad-smelling fluid coming from your vagina.  You have pain when you pee, or your pee smells bad.  You have a headache that does not go away when you take medicine.  You have changes in how you see, or you see spots in front of your eyes. Get help right away if:  Your water breaks.  You have regular contractions that are less than 5 minutes apart.  You are spotting or bleeding from your vagina.  You have very bad belly cramps or pain.  You have trouble breathing.  You have chest pain.  You  faint.  You have not felt the baby move for the amount of time told by your doctor.  You have new or increased pain, swelling, or redness in an arm or leg. Summary  The third trimester is from week 28 through week 40 (months 7 through 9). This is the time when your unborn baby is growing very fast.  During this time, your discomfort may increase as you gain weight and as your baby grows.  Get ready for your baby to arrive by taking prenatal classes, buying a rear-facing car seat, and preparing the baby's room.  Get help right away if you are bleeding from your vagina, you have chest pain and trouble breathing, or you have not felt the baby move for the amount of time told by your doctor. This information is not intended to replace advice given to you by your health care provider. Make sure you discuss any questions you have with your health care provider. Document Revised: 01/28/2020 Document Reviewed: 12/04/2019 Elsevier Patient Education  2021 [employer]

## 2020-10-26 NOTE — Progress Notes (Addendum)
ROB: Noting increased pelvic pressure and nausea. Also noting some issues with hemorrhoids (not painful but uncomfortable) and bleeding. Discussed home comfort measures. Denies constipation currently. Desires to attempt breastfeeding/pumping.  Notes she desires BTL, to sign BTL papers next visit. RTC in 2 weeks, for 28 glucose test, Tdap, and f/u growth scan. Also will order hepatic function panel as she has bilirubinuria.  The following were addressed during this visit:  Breastfeeding Education - Early initiation of breastfeeding    Comments: Keeps milk supply adequate, helps contract uterus and slow bleeding, and early milk is the perfect first food and is easy to digest.   - The importance of exclusive breastfeeding    Comments: Provides antibodies, Lower risk of breast and ovarian cancers, and type-2 diabetes,Helps your body recover, Reduced chance of SIDS.   - Risks of giving your baby anything other than breast milk if you are breastfeeding    Comments: Make the baby less content with breastfeeds, may make my baby more susceptible to illness, and may reduce my milk supply.   - Rooming-in on a 24-hour basis    Comments: Easier to learn baby's feeding cues, easier to bond and get to know each other, and encourages milk production.   - Feeding on demand or baby-led feeding    Comments: Helps prevent breastfeeding complications, helps bring in good milk supply, prevents under or overfeeding, and helps baby feel content and satisfied   - Frequent feeding to help assure optimal milk production    Comments: Making a full supply of milk requires frequent removal of milk from breasts, infant will eat 8-12 times in 24 hours, if separated from infant use breast massage, hand expression and/ or pumping to remove milk from breasts.   - Effective positioning and attachment    Comments: Helps my baby to get enough breast milk, helps to produce an adequate milk supply, and helps prevent nipple  pain and damage   - Exclusive breastfeeding for the first 6 months    Comments: Builds a healthy milk supply and keeps it up, protects baby from sickness and disease, and breastmilk has everything your baby needs for the first 6 months.

## 2020-11-02 ENCOUNTER — Other Ambulatory Visit: Payer: Medicaid Other

## 2020-11-02 ENCOUNTER — Encounter: Payer: Medicaid Other | Admitting: Obstetrics and Gynecology

## 2020-11-02 ENCOUNTER — Ambulatory Visit (INDEPENDENT_AMBULATORY_CARE_PROVIDER_SITE_OTHER): Payer: Medicaid Other | Admitting: Obstetrics and Gynecology

## 2020-11-02 ENCOUNTER — Encounter: Payer: Self-pay | Admitting: Obstetrics and Gynecology

## 2020-11-02 ENCOUNTER — Other Ambulatory Visit: Payer: Self-pay

## 2020-11-02 VITALS — BP 95/60 | HR 68 | Wt 186.6 lb

## 2020-11-02 DIAGNOSIS — R822 Biliuria: Secondary | ICD-10-CM

## 2020-11-02 DIAGNOSIS — Z23 Encounter for immunization: Secondary | ICD-10-CM

## 2020-11-02 DIAGNOSIS — Z3A28 28 weeks gestation of pregnancy: Secondary | ICD-10-CM

## 2020-11-02 DIAGNOSIS — Z3483 Encounter for supervision of other normal pregnancy, third trimester: Secondary | ICD-10-CM

## 2020-11-02 LAB — POCT URINALYSIS DIPSTICK OB
Bilirubin, UA: NEGATIVE
Blood, UA: NEGATIVE
Glucose, UA: NEGATIVE
Ketones, UA: NEGATIVE
Leukocytes, UA: NEGATIVE
Nitrite, UA: NEGATIVE
POC,PROTEIN,UA: NEGATIVE
Spec Grav, UA: 1.015 (ref 1.010–1.025)
Urobilinogen, UA: 0.2 E.U./dL
pH, UA: 5 (ref 5.0–8.0)

## 2020-11-02 MED ORDER — TETANUS-DIPHTH-ACELL PERTUSSIS 5-2.5-18.5 LF-MCG/0.5 IM SUSY
0.5000 mL | PREFILLED_SYRINGE | Freq: Once | INTRAMUSCULAR | Status: AC
  Administered 2020-11-02: 0.5 mL via INTRAMUSCULAR

## 2020-11-02 NOTE — Progress Notes (Signed)
ROB: Patient says she feels tired but otherwise well.  1 hour GCT today.  Liver panel as previously ordered.

## 2020-11-03 LAB — CBC
Hematocrit: 32.3 % — ABNORMAL LOW (ref 34.0–46.6)
Hemoglobin: 11.2 g/dL (ref 11.1–15.9)
MCH: 33.3 pg — ABNORMAL HIGH (ref 26.6–33.0)
MCHC: 34.7 g/dL (ref 31.5–35.7)
MCV: 96 fL (ref 79–97)
Platelets: 172 10*3/uL (ref 150–450)
RBC: 3.36 x10E6/uL — ABNORMAL LOW (ref 3.77–5.28)
RDW: 12.2 % (ref 11.7–15.4)
WBC: 9.4 10*3/uL (ref 3.4–10.8)

## 2020-11-03 LAB — HEPATIC FUNCTION PANEL
ALT: 12 IU/L (ref 0–32)
AST: 20 IU/L (ref 0–40)
Albumin: 3.9 g/dL (ref 3.9–5.0)
Alkaline Phosphatase: 109 IU/L (ref 44–121)
Bilirubin Total: <0.2 mg/dL (ref 0.0–1.2)
Bilirubin, Direct: <0.1 mg/dL (ref 0.00–0.40)
Total Protein: 6.5 g/dL (ref 6.0–8.5)

## 2020-11-03 LAB — GLUCOSE, 1 HOUR GESTATIONAL: Gestational Diabetes Screen: 71 mg/dL (ref 65–139)

## 2020-11-03 LAB — RPR: RPR Ser Ql: NONREACTIVE

## 2020-11-03 LAB — HEPATITIS C ANTIBODY: Hep C Virus Ab: <0.1 s/co ratio (ref 0.0–0.9)

## 2020-11-15 ENCOUNTER — Other Ambulatory Visit: Payer: Self-pay

## 2020-11-15 ENCOUNTER — Ambulatory Visit
Admission: RE | Admit: 2020-11-15 | Discharge: 2020-11-15 | Disposition: A | Discharge status: Home / Self Care | Payer: Medicaid Other | Source: Ambulatory Visit | Attending: Obstetrics and Gynecology | Admitting: Obstetrics and Gynecology

## 2020-11-15 DIAGNOSIS — O26842 Uterine size-date discrepancy, second trimester: Secondary | ICD-10-CM | POA: Insufficient documentation

## 2020-11-23 ENCOUNTER — Other Ambulatory Visit: Payer: Self-pay

## 2020-11-23 ENCOUNTER — Ambulatory Visit (INDEPENDENT_AMBULATORY_CARE_PROVIDER_SITE_OTHER): Payer: PRIVATE HEALTH INSURANCE | Admitting: Obstetrics and Gynecology

## 2020-11-23 ENCOUNTER — Encounter: Payer: Self-pay | Admitting: Obstetrics and Gynecology

## 2020-11-23 VITALS — BP 127/80 | HR 112 | Wt 190.9 lb

## 2020-11-23 DIAGNOSIS — Z8619 Personal history of other infectious and parasitic diseases: Secondary | ICD-10-CM

## 2020-11-23 DIAGNOSIS — Z3A31 31 weeks gestation of pregnancy: Secondary | ICD-10-CM

## 2020-11-23 DIAGNOSIS — O0993 Supervision of high risk pregnancy, unspecified, third trimester: Secondary | ICD-10-CM

## 2020-11-23 LAB — POCT URINALYSIS DIPSTICK OB
Bilirubin, UA: NEGATIVE
Blood, UA: NEGATIVE
Glucose, UA: NEGATIVE
Ketones, UA: NEGATIVE
Leukocytes, UA: NEGATIVE
Nitrite, UA: NEGATIVE
POC,PROTEIN,UA: NEGATIVE
Spec Grav, UA: 1.025 (ref 1.010–1.025)
Urobilinogen, UA: 0.2 E.U./dL
pH, UA: 6 (ref 5.0–8.0)

## 2020-11-23 NOTE — Progress Notes (Signed)
ROB: Notes Nicole Kramer and vaginal pressure, more intense when she is physically active, resolves with rest. Discussion had regarding pain management in labor, plans to go natural if possible (notes she was induced with previous pregnancies for HSV?). Discussed that this was not a required reason for IOL, can proceed with spontaneous labor as long as she is asymptomatic and on her suppression medications.  RTC in 2 weeks.

## 2020-11-23 NOTE — Progress Notes (Signed)
OB-Pt present for routine prenatal care. Pt stated having braxton hick contractions and anal pressure. No other issues at the time.

## 2020-12-06 ENCOUNTER — Encounter: Payer: PRIVATE HEALTH INSURANCE | Admitting: Obstetrics and Gynecology

## 2020-12-07 ENCOUNTER — Ambulatory Visit (INDEPENDENT_AMBULATORY_CARE_PROVIDER_SITE_OTHER): Payer: PRIVATE HEALTH INSURANCE | Admitting: Obstetrics and Gynecology

## 2020-12-07 ENCOUNTER — Other Ambulatory Visit: Payer: Self-pay

## 2020-12-07 ENCOUNTER — Encounter: Payer: Self-pay | Admitting: Obstetrics and Gynecology

## 2020-12-07 VITALS — BP 114/73 | HR 97 | Wt 193.0 lb

## 2020-12-07 DIAGNOSIS — Z3A33 33 weeks gestation of pregnancy: Secondary | ICD-10-CM

## 2020-12-07 DIAGNOSIS — O0993 Supervision of high risk pregnancy, unspecified, third trimester: Secondary | ICD-10-CM

## 2020-12-07 LAB — POCT URINALYSIS DIPSTICK OB
Bilirubin, UA: NEGATIVE
Blood, UA: NEGATIVE
Glucose, UA: NEGATIVE
Ketones, UA: NEGATIVE
Leukocytes, UA: NEGATIVE
Nitrite, UA: NEGATIVE
Spec Grav, UA: 1.01 (ref 1.010–1.025)
Urobilinogen, UA: 0.2 E.U./dL
pH, UA: 6.5 (ref 5.0–8.0)

## 2020-12-07 NOTE — Progress Notes (Signed)
ROB: No specific complaints.  Reports daily fetal movement.  Taking vitamins as directed.

## 2020-12-15 ENCOUNTER — Telehealth: Payer: Self-pay | Admitting: Obstetrics and Gynecology

## 2020-12-15 ENCOUNTER — Encounter: Payer: Self-pay | Admitting: Obstetrics and Gynecology

## 2020-12-15 ENCOUNTER — Ambulatory Visit (INDEPENDENT_AMBULATORY_CARE_PROVIDER_SITE_OTHER): Payer: Medicaid Other | Admitting: Obstetrics and Gynecology

## 2020-12-15 ENCOUNTER — Other Ambulatory Visit: Payer: Self-pay

## 2020-12-15 VITALS — BP 110/67 | HR 84 | Wt 196.2 lb

## 2020-12-15 DIAGNOSIS — O368130 Decreased fetal movements, third trimester, not applicable or unspecified: Secondary | ICD-10-CM | POA: Diagnosis not present

## 2020-12-15 DIAGNOSIS — R102 Pelvic and perineal pain: Secondary | ICD-10-CM

## 2020-12-15 DIAGNOSIS — N949 Unspecified condition associated with female genital organs and menstrual cycle: Secondary | ICD-10-CM

## 2020-12-15 DIAGNOSIS — Z3A34 34 weeks gestation of pregnancy: Secondary | ICD-10-CM

## 2020-12-15 DIAGNOSIS — O0993 Supervision of high risk pregnancy, unspecified, third trimester: Secondary | ICD-10-CM

## 2020-12-15 DIAGNOSIS — O26893 Other specified pregnancy related conditions, third trimester: Secondary | ICD-10-CM

## 2020-12-15 LAB — POCT URINALYSIS DIPSTICK OB
Bilirubin, UA: NEGATIVE
Blood, UA: NEGATIVE
Glucose, UA: NEGATIVE
Ketones, UA: NEGATIVE
Nitrite, UA: NEGATIVE
POC,PROTEIN,UA: NEGATIVE
Spec Grav, UA: 1.015 (ref 1.010–1.025)
Urobilinogen, UA: 0.2 E.U./dL
pH, UA: 6.5 (ref 5.0–8.0)

## 2020-12-15 NOTE — Telephone Encounter (Signed)
New Message:  Pt states that she is having pain in her right side and it doesn't seem to be easing up. She was asking if she could be seen today, she doesn't want to go sit at the hospital

## 2020-12-15 NOTE — Progress Notes (Signed)
Problem OB Visit: Patient reports worsening left sided pelvic and leg pain that also radiates to the back over the past day or 2. Has not taken anything for the pain. Denies bowel or urinary issues. UA negative today. Also has not felt her baby move since last night. NST performed today was reviewed and was found to be reactive.  Continue routine prenatal care. Advised on Tylenol, stretches, for pain.    NONSTRESS TEST INTERPRETATION  INDICATIONS: Decreased fetal movement  FHR baseline: 140 bpm RESULTS:Reactive (with use of acoustic stimulator) COMMENTS: No contractions   PLAN: 1. Continue fetal kick counts twice a day.

## 2020-12-15 NOTE — Telephone Encounter (Signed)
Tried to call the patient back and the mailbox is full.

## 2020-12-15 NOTE — Patient Instructions (Addendum)
Fetal Movement Counts Patient Name: ________________________________________________ Patient Due Date: ____________________  What is a fetal movement count? A fetal movement count is the number of times that you feel your baby move during a certain amount of time. This may also be called a fetal kick count. A fetal movement count is recommended for every pregnant woman. You may be asked to start counting fetal movements as early as week 28 of your pregnancy. Pay attention to when your baby is most active. You may notice your baby's sleep and wake cycles. You may also notice things that make your baby move more. You should do a fetal movement count:  When your baby is normally most active.  At the same time each day. A good time to count movements is while you are resting, after having something to eat and drink. How do I count fetal movements? 1. Find a quiet, comfortable area. Sit, or lie down on your side. 2. Write down the date, the start time and stop time, and the number of movements that you felt between those two times. Take this information with you to your health care visits. 3. Write down your start time when you feel the first movement. 4. Count kicks, flutters, swishes, rolls, and jabs. You should feel at least 10 movements. 5. You may stop counting after you have felt 10 movements, or if you have been counting for 2 hours. Write down the stop time. 6. If you do not feel 10 movements in 2 hours, contact your health care provider for further instructions. Your health care provider may want to do additional tests to assess your baby's well-being. Contact a health care provider if:  You feel fewer than 10 movements in 2 hours.  Your baby is not moving like he or she usually does. Date: ____________ Start time: ____________ Stop time: ____________ Movements: ____________ Date: ____________ Start time: ____________ Stop time: ____________ Movements: ____________ Date: ____________  Start time: ____________ Stop time: ____________ Movements: ____________ Date: ____________ Start time: ____________ Stop time: ____________ Movements: ____________ Date: ____________ Start time: ____________ Stop time: ____________ Movements: ____________ Date: ____________ Start time: ____________ Stop time: ____________ Movements: ____________ Date: ____________ Start time: ____________ Stop time: ____________ Movements: ____________ Date: ____________ Start time: ____________ Stop time: ____________ Movements: ____________ Date: ____________ Start time: ____________ Stop time: ____________ Movements: ____________ This information is not intended to replace advice given to you by your health care provider. Make sure you discuss any questions you have with your health care provider. Document Revised: 04/10/2019 Document Reviewed: 04/10/2019 Elsevier Patient Education  2021 [employer] Round Ligament Pain  The round ligament is a cord of muscle and tissue that helps support the uterus. It can become a source of pain during pregnancy if it becomes stretched or twisted as the baby grows. The pain usually begins in the second trimester (13-28 weeks) of pregnancy, and it can come and go until the baby is delivered. It is not a serious problem, and it does not cause harm to the baby. Round ligament pain is usually a short, sharp, and pinching pain, but it can also be a dull, lingering, and aching pain. The pain is felt in the lower side of the abdomen or in the groin. It usually starts deep in the groin and moves up to the outside of the hip area. The pain may occur when you:  Suddenly change position, such as quickly going from a sitting to standing position.  Roll over in bed.  Cough or   sneeze.  Do physical activity. Follow these instructions at home:  Watch your condition for any changes.  When the pain starts, relax. Then try any of these methods to help with the pain: ? Sitting  down. ? Flexing your knees up to your abdomen. ? Lying on your side with one pillow under your abdomen and another pillow between your legs. ? Sitting in a warm bath for 15-20 minutes or until the pain goes away.  Take over-the-counter and prescription medicines only as told by your health care provider.  Move slowly when you sit down or stand up.  Avoid long walks if they cause pain.  Stop or reduce your physical activities if they cause pain.  Keep all follow-up visits as told by your health care provider. This is important.   Contact a health care provider if:  Your pain does not go away with treatment.  You feel pain in your back that you did not have before.  Your medicine is not helping. Get help right away if:  You have a fever or chills.  You develop uterine contractions.  You have vaginal bleeding.  You have nausea or vomiting.  You have diarrhea.  You have pain when you urinate. Summary  Round ligament pain is felt in the lower abdomen or groin. It is usually a short, sharp, and pinching pain. It can also be a dull, lingering, and aching pain.  This pain usually begins in the second trimester (13-28 weeks). It occurs because the uterus is stretching with the growing baby, and it is not harmful to the baby.  You may notice the pain when you suddenly change position, when you cough or sneeze, or during physical activity.  Relaxing, flexing your knees to your abdomen, lying on one side, or taking a warm bath may help to get rid of the pain.  Get help from your health care provider if the pain does not go away or if you have vaginal bleeding, nausea, vomiting, diarrhea, or painful urination. This information is not intended to replace advice given to you by your health care provider. Make sure you discuss any questions you have with your health care provider. Document Revised: 02/06/2018 Document Reviewed: 02/06/2018 Elsevier Patient Education  2021 Elsevier  Inc.  

## 2020-12-15 NOTE — Telephone Encounter (Signed)
Spoke with patient and her pain is getting worse. She stated that the last time she remembers feeling the baby was last night before bed. I have scheduled her to see Dr. Valentino Saxon at 4 today. Dr Valentino Saxon is aware.

## 2020-12-15 NOTE — Progress Notes (Signed)
OB-Pt present due to not feeling the baby move since yesterday. NST completed and fetal movement present.

## 2020-12-15 NOTE — Telephone Encounter (Signed)
Spoke with patient about her concerns. Patient denies bloody show or loss of fluid. Patient stated that her pains have been about three to five  minutes apart since they today. They are on the left side and in the pelvic area. Patient stated that she has not really felt the baby move today. I advised patient to lay down and relax to see if this stops the contractions. Ask her to try to do the tricks to make the baby move. I let her know that the providers was not in the office at this time. When they get back I will call her and let her know if she needs to go to hospital.

## 2020-12-15 NOTE — Telephone Encounter (Signed)
Tried to call patient back. Mail box is full. Unable to leave a message.

## 2020-12-21 ENCOUNTER — Encounter: Payer: PRIVATE HEALTH INSURANCE | Admitting: Obstetrics and Gynecology

## 2020-12-31 ENCOUNTER — Ambulatory Visit (INDEPENDENT_AMBULATORY_CARE_PROVIDER_SITE_OTHER): Payer: PRIVATE HEALTH INSURANCE | Admitting: Obstetrics and Gynecology

## 2020-12-31 ENCOUNTER — Other Ambulatory Visit: Payer: Self-pay

## 2020-12-31 ENCOUNTER — Encounter: Payer: Self-pay | Admitting: Obstetrics and Gynecology

## 2020-12-31 VITALS — BP 141/78 | HR 105 | Ht 67.0 in | Wt 197.6 lb

## 2020-12-31 DIAGNOSIS — Z3A36 36 weeks gestation of pregnancy: Secondary | ICD-10-CM

## 2020-12-31 DIAGNOSIS — Z3685 Encounter for antenatal screening for Streptococcus B: Secondary | ICD-10-CM

## 2020-12-31 DIAGNOSIS — R252 Cramp and spasm: Secondary | ICD-10-CM

## 2020-12-31 DIAGNOSIS — Z8619 Personal history of other infectious and parasitic diseases: Secondary | ICD-10-CM

## 2020-12-31 DIAGNOSIS — G2581 Restless legs syndrome: Secondary | ICD-10-CM

## 2020-12-31 DIAGNOSIS — O0993 Supervision of high risk pregnancy, unspecified, third trimester: Secondary | ICD-10-CM

## 2020-12-31 DIAGNOSIS — O99891 Other specified diseases and conditions complicating pregnancy: Secondary | ICD-10-CM

## 2020-12-31 MED ORDER — VALACYCLOVIR HCL 500 MG PO TABS
500.0000 mg | ORAL_TABLET | Freq: Two times a day (BID) | ORAL | 1 refills | Status: DC
Start: 2020-12-31 — End: 2021-01-28

## 2020-12-31 NOTE — Patient Instructions (Signed)

## 2020-12-31 NOTE — Progress Notes (Signed)
OB-Pt present for routine prenatal care. Pt c/o leg cramps, braxton hick contractions and round liga pains.  36 week cultures completed.

## 2020-12-31 NOTE — Progress Notes (Signed)
ROB: Reports leg cramps at night.  Eats bananas to help. Likely restless leg syndrome. Discussed supportive measures.  Also noting worsening varicose veins. Discussed comfort measures. 36 week labs done.  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 RTC in 1 week.

## 2021-01-02 LAB — STREP GP B NAA: Strep Gp B NAA: POSITIVE — AB

## 2021-01-03 LAB — GC/CHLAMYDIA PROBE AMP
Chlamydia trachomatis, NAA: NEGATIVE
Neisseria Gonorrhoeae by PCR: NEGATIVE

## 2021-01-04 ENCOUNTER — Encounter: Payer: Self-pay | Admitting: Obstetrics and Gynecology

## 2021-01-04 ENCOUNTER — Observation Stay
Admission: EM | Admit: 2021-01-04 | Discharge: 2021-01-04 | Disposition: A | Discharge status: Home / Self Care | Payer: Medicaid Other | Attending: Obstetrics and Gynecology | Admitting: Obstetrics and Gynecology

## 2021-01-04 ENCOUNTER — Other Ambulatory Visit: Payer: Self-pay

## 2021-01-04 DIAGNOSIS — Z3A37 37 weeks gestation of pregnancy: Secondary | ICD-10-CM

## 2021-01-04 DIAGNOSIS — O4193X1 Disorder of amniotic fluid and membranes, unspecified, third trimester, fetus 1: Principal | ICD-10-CM | POA: Insufficient documentation

## 2021-01-04 DIAGNOSIS — Z3490 Encounter for supervision of normal pregnancy, unspecified, unspecified trimester: Secondary | ICD-10-CM

## 2021-01-04 DIAGNOSIS — O26893 Other specified pregnancy related conditions, third trimester: Secondary | ICD-10-CM

## 2021-01-04 LAB — RUPTURE OF MEMBRANE (ROM)PLUS: Rom Plus: NEGATIVE

## 2021-01-04 NOTE — OB Triage Note (Signed)
Patient arrives in triage with c/o "gush of water" at work around 1pm. No further fluid noted after that. Denies vaginal bleeding. Reports some back pain and pressure but denies having contractions. Reports good fetal movement. EFM applied and assessing. Fetal movement noted on palpation of abdomen.

## 2021-01-04 NOTE — OB Triage Note (Signed)
Pt discharged in good condition after ROM test came back negative, teach back method used with RN at bedside for discharge instructions to come back with LOF, vaginal bleeding, contractions, or decreased fetal movement. Pt verbalized understanding.

## 2021-01-06 NOTE — Discharge Summary (Signed)
    L&D OB Triage Note  SUBJECTIVE Nicole Kramer is a 25 y.o. [id number] female at [redacted]w[redacted]d, EDD Estimated Date of Delivery: 01/23/21 who presented to triage with complaints of single gush of clear fluid.  Patient has had no leakage since.  Denies vaginal bleeding, reports active movement, denies contractions.  OB History  Gravida Para Term Preterm AB Living  7 4 4  0 2 4  SAB IAB Ectopic Multiple Live Births  2 0 0 0 4    # Outcome Date GA Lbr Len/2nd Weight Sex Delivery Anes PTL Lv  7 Current           6 Term 01/03/19 [redacted]w[redacted]d   M Vag-Spont   LIV  5 SAB 01/2018          4 SAB 01/2017          3 Term 06/24/15 [redacted]w[redacted]d / 00:01 3480 g M Vag-Spont None  LIV     Name: Leveille,BOY Shantia     Apgar1: 9  Apgar5: 9  2 Term 12/12/13 [redacted]w[redacted]d  3175 g M Vag-Spont   LIV  1 Term 05/30/12 [redacted]w[redacted]d  3175 g M Vag-Spont   LIV    No medications prior to admission.     OBJECTIVE  Nursing Evaluation:   BP 101/65   Pulse 98   Temp 98.5 F (36.9 C) (Oral)   LMP 04/28/2020 (Approximate) Comment: lasted only 2 days. last normal period 03/28/20   Findings:        ROM plus-negative     No evidence of ROM      NST was performed and has been reviewed by me.  NST INTERPRETATION: Category I  Mode: External Baseline Rate (A): 135 bpm Variability: Moderate Accelerations: 15 x 15 Decelerations: None     Contraction Frequency (min): occasional  ASSESSMENT Impression:  1.  Pregnancy:  03/30/20 at [redacted]w[redacted]d , EDD Estimated Date of Delivery: 01/23/21 2.  Reassuring fetal and maternal status   PLAN 1.  Nurse has discussed the findings with the patient.  No evidence of ROM. 2. Discharge home with standard labor precautions given to return to L&D or call the office for problems. 3. Continue routine prenatal care.

## 2021-01-11 ENCOUNTER — Encounter: Payer: Self-pay | Admitting: Obstetrics and Gynecology

## 2021-01-11 ENCOUNTER — Ambulatory Visit (INDEPENDENT_AMBULATORY_CARE_PROVIDER_SITE_OTHER): Payer: PRIVATE HEALTH INSURANCE | Admitting: Obstetrics and Gynecology

## 2021-01-11 ENCOUNTER — Other Ambulatory Visit: Payer: Self-pay

## 2021-01-11 VITALS — BP 116/78 | HR 96 | Wt 196.2 lb

## 2021-01-11 DIAGNOSIS — O0993 Supervision of high risk pregnancy, unspecified, third trimester: Secondary | ICD-10-CM

## 2021-01-11 DIAGNOSIS — Z3A38 38 weeks gestation of pregnancy: Secondary | ICD-10-CM

## 2021-01-11 NOTE — Progress Notes (Signed)
ROB: Patient doing well.  Denies contractions.  Reports daily fetal movement.

## 2021-01-19 ENCOUNTER — Ambulatory Visit (INDEPENDENT_AMBULATORY_CARE_PROVIDER_SITE_OTHER): Payer: PRIVATE HEALTH INSURANCE | Admitting: Obstetrics and Gynecology

## 2021-01-19 ENCOUNTER — Encounter: Payer: Self-pay | Admitting: Obstetrics and Gynecology

## 2021-01-19 ENCOUNTER — Other Ambulatory Visit: Payer: Self-pay

## 2021-01-19 VITALS — BP 107/69 | HR 106 | Ht 67.0 in | Wt 197.2 lb

## 2021-01-19 DIAGNOSIS — O0993 Supervision of high risk pregnancy, unspecified, third trimester: Secondary | ICD-10-CM

## 2021-01-19 DIAGNOSIS — R102 Pelvic and perineal pain: Secondary | ICD-10-CM

## 2021-01-19 DIAGNOSIS — O26899 Other specified pregnancy related conditions, unspecified trimester: Secondary | ICD-10-CM

## 2021-01-19 DIAGNOSIS — Z3A39 39 weeks gestation of pregnancy: Secondary | ICD-10-CM

## 2021-01-19 LAB — POCT URINALYSIS DIPSTICK OB
Bilirubin, UA: NEGATIVE
Blood, UA: NEGATIVE
Glucose, UA: NEGATIVE
Ketones, UA: NEGATIVE
Leukocytes, UA: NEGATIVE
Nitrite, UA: NEGATIVE
POC,PROTEIN,UA: NEGATIVE
Spec Grav, UA: 1.025 (ref 1.010–1.025)
Urobilinogen, UA: 0.2 E.U./dL
pH, UA: 6 (ref 5.0–8.0)

## 2021-01-19 NOTE — Progress Notes (Signed)
ROB: Patient doing well, no major complaints. Feeling pelvic pressure but no contractions. Discussed IOL if no labor by 41 weeks.  Will schedule for 01/27/21 at midnight. RTC in 1 week, for NST at that visit.

## 2021-01-19 NOTE — Patient Instructions (Signed)

## 2021-01-19 NOTE — Progress Notes (Signed)
OB-Pt present for routine prenatal care. Pt stated that she was doing well.  

## 2021-01-25 ENCOUNTER — Other Ambulatory Visit: Payer: Self-pay

## 2021-01-25 ENCOUNTER — Other Ambulatory Visit
Admission: RE | Admit: 2021-01-25 | Discharge: 2021-01-25 | Disposition: A | Discharge status: Home / Self Care | Payer: Medicaid Other | Source: Ambulatory Visit | Attending: Obstetrics and Gynecology | Admitting: Obstetrics and Gynecology

## 2021-01-25 DIAGNOSIS — Z20822 Contact with and (suspected) exposure to covid-19: Secondary | ICD-10-CM | POA: Insufficient documentation

## 2021-01-25 DIAGNOSIS — Z01812 Encounter for preprocedural laboratory examination: Secondary | ICD-10-CM | POA: Diagnosis present

## 2021-01-25 LAB — SARS CORONAVIRUS 2 (TAT 6-24 HRS): SARS Coronavirus 2: NEGATIVE

## 2021-01-26 ENCOUNTER — Ambulatory Visit (INDEPENDENT_AMBULATORY_CARE_PROVIDER_SITE_OTHER): Payer: Medicaid Other | Admitting: Obstetrics and Gynecology

## 2021-01-26 ENCOUNTER — Encounter: Payer: Self-pay | Admitting: Obstetrics and Gynecology

## 2021-01-26 ENCOUNTER — Other Ambulatory Visit: Payer: PRIVATE HEALTH INSURANCE

## 2021-01-26 ENCOUNTER — Other Ambulatory Visit: Payer: Self-pay

## 2021-01-26 DIAGNOSIS — O0993 Supervision of high risk pregnancy, unspecified, third trimester: Secondary | ICD-10-CM | POA: Diagnosis not present

## 2021-01-26 DIAGNOSIS — Z3A40 40 weeks gestation of pregnancy: Secondary | ICD-10-CM

## 2021-01-26 LAB — POCT URINALYSIS DIPSTICK OB
Bilirubin, UA: NEGATIVE
Glucose, UA: NEGATIVE
Ketones, UA: NEGATIVE
Nitrite, UA: NEGATIVE
Odor: NEGATIVE
POC,PROTEIN,UA: NEGATIVE
Spec Grav, UA: 1.01 (ref 1.010–1.025)
Urobilinogen, UA: 0.2 E.U./dL
pH, UA: 6.5 (ref 5.0–8.0)

## 2021-01-26 NOTE — Progress Notes (Signed)
ROB and NST for post dates, IOL on 5/26. NO complaints.  NONSTRESS TEST INTERPRETATION  INDICATIONS: Post dates FHR baseline: RESULTS:Reactive COMMENTS:    PLAN: 1. Continue fetal kick counts twice a day. 2. Continue antepartum testing as scheduled-Biweekly 3.  Darol Destine, CMA

## 2021-01-26 NOTE — Progress Notes (Signed)
ROB: Induction discussed.  Questions answered.

## 2021-01-27 ENCOUNTER — Inpatient Hospital Stay
Admission: EM | Admit: 2021-01-27 | Discharge: 2021-01-28 | DRG: 832 | Disposition: A | Discharge status: Home / Self Care | Payer: Medicaid Other | Attending: Obstetrics and Gynecology | Admitting: Obstetrics and Gynecology

## 2021-01-27 ENCOUNTER — Encounter
Admission: EM | Disposition: A | Discharge status: Home / Self Care | Payer: Self-pay | Source: Home / Self Care | Attending: Obstetrics and Gynecology

## 2021-01-27 ENCOUNTER — Encounter: Payer: Self-pay | Admitting: Obstetrics and Gynecology

## 2021-01-27 DIAGNOSIS — Z3A40 40 weeks gestation of pregnancy: Secondary | ICD-10-CM

## 2021-01-27 DIAGNOSIS — Z87891 Personal history of nicotine dependence: Secondary | ICD-10-CM

## 2021-01-27 DIAGNOSIS — A6000 Herpesviral infection of urogenital system, unspecified: Secondary | ICD-10-CM | POA: Diagnosis present

## 2021-01-27 DIAGNOSIS — O480 Post-term pregnancy: Principal | ICD-10-CM

## 2021-01-27 DIAGNOSIS — O99824 Streptococcus B carrier state complicating childbirth: Secondary | ICD-10-CM | POA: Diagnosis present

## 2021-01-27 DIAGNOSIS — O9832 Other infections with a predominantly sexual mode of transmission complicating childbirth: Secondary | ICD-10-CM | POA: Diagnosis present

## 2021-01-27 LAB — CBC
HCT: 35.7 % — ABNORMAL LOW (ref 36.0–46.0)
Hemoglobin: 12.1 g/dL (ref 12.0–15.0)
MCH: 32.6 pg (ref 26.0–34.0)
MCHC: 33.9 g/dL (ref 30.0–36.0)
MCV: 96.2 fL (ref 80.0–100.0)
Platelets: 199 10*3/uL (ref 150–400)
RBC: 3.71 MIL/uL — ABNORMAL LOW (ref 3.87–5.11)
RDW: 13.3 % (ref 11.5–15.5)
WBC: 10.9 10*3/uL — ABNORMAL HIGH (ref 4.0–10.5)
nRBC: 0 % (ref 0.0–0.2)

## 2021-01-27 LAB — RPR: RPR Ser Ql: NONREACTIVE

## 2021-01-27 SURGERY — LIGATION, FALLOPIAN TUBE, POSTPARTUM
Anesthesia: Choice

## 2021-01-27 MED ORDER — LACTATED RINGERS IV SOLN
125.0000 mL/h | INTRAVENOUS | Status: DC
Start: 2021-01-27 — End: 2021-01-28
  Administered 2021-01-27: 125 mL/h via INTRAVENOUS

## 2021-01-27 MED ORDER — ACETAMINOPHEN 325 MG PO TABS: 650.0000 mg | ORAL_TABLET | ORAL | Status: DC | PRN

## 2021-01-27 MED ORDER — LACTATED RINGERS IV BOLUS
1000.0000 mL | Freq: Once | INTRAVENOUS | Status: AC
  Administered 2021-01-27: 1000 mL via INTRAVENOUS

## 2021-01-27 MED ORDER — IBUPROFEN 600 MG PO TABS
ORAL_TABLET | ORAL | Status: AC
  Filled 2021-01-27: qty 1

## 2021-01-27 MED ORDER — SODIUM CHLORIDE 0.9 % IV SOLN
1.0000 g | INTRAVENOUS | Status: DC
  Administered 2021-01-27 (×2): 1 g via INTRAVENOUS
  Filled 2021-01-27 (×6): qty 1000

## 2021-01-27 MED ORDER — SOD CITRATE-CITRIC ACID 500-334 MG/5ML PO SOLN
30.0000 mL | ORAL | Status: DC | PRN
  Administered 2021-01-27: 30 mL via ORAL
  Filled 2021-01-27: qty 15

## 2021-01-27 MED ORDER — MISOPROSTOL 200 MCG PO TABS
ORAL_TABLET | ORAL | Status: AC
  Filled 2021-01-27: qty 4

## 2021-01-27 MED ORDER — OXYCODONE-ACETAMINOPHEN 5-325 MG PO TABS
2.0000 | ORAL_TABLET | ORAL | Status: DC | PRN
Start: 2021-01-27 — End: 2021-01-28

## 2021-01-27 MED ORDER — BENZOCAINE-MENTHOL 20-0.5 % EX AERO: 1.0000 "application " | INHALATION_SPRAY | CUTANEOUS | Status: DC | PRN

## 2021-01-27 MED ORDER — SIMETHICONE 80 MG PO CHEW: 80.0000 mg | CHEWABLE_TABLET | ORAL | Status: DC | PRN

## 2021-01-27 MED ORDER — MISOPROSTOL 25 MCG QUARTER TABLET
50.0000 ug | ORAL_TABLET | ORAL | Status: DC | PRN
  Administered 2021-01-27: 50 ug via VAGINAL
  Filled 2021-01-27: qty 2
  Filled 2021-01-27 (×2): qty 1

## 2021-01-27 MED ORDER — OXYCODONE-ACETAMINOPHEN 5-325 MG PO TABS
1.0000 | ORAL_TABLET | ORAL | Status: DC | PRN
Start: 2021-01-27 — End: 2021-01-28

## 2021-01-27 MED ORDER — OXYTOCIN BOLUS FROM INFUSION: 333.0000 mL | Freq: Once | INTRAVENOUS | Status: DC

## 2021-01-27 MED ORDER — AMMONIA AROMATIC IN INHA
RESPIRATORY_TRACT | Status: AC
  Filled 2021-01-27: qty 10

## 2021-01-27 MED ORDER — DIPHENHYDRAMINE HCL 25 MG PO CAPS: 25.0000 mg | ORAL_CAPSULE | Freq: Four times a day (QID) | ORAL | Status: DC | PRN

## 2021-01-27 MED ORDER — LIDOCAINE HCL (PF) 1 % IJ SOLN
30.0000 mL | INTRAMUSCULAR | Status: DC | PRN
  Filled 2021-01-27: qty 30

## 2021-01-27 MED ORDER — IBUPROFEN 600 MG PO TABS
600.0000 mg | ORAL_TABLET | Freq: Four times a day (QID) | ORAL | Status: DC
  Administered 2021-01-27 – 2021-01-28 (×4): 600 mg via ORAL
  Filled 2021-01-27 (×3): qty 1

## 2021-01-27 MED ORDER — OXYTOCIN-SODIUM CHLORIDE 30-0.9 UT/500ML-% IV SOLN
2.5000 [IU]/h | INTRAVENOUS | Status: DC
  Administered 2021-01-27: 2.5 [IU]/h via INTRAVENOUS
  Filled 2021-01-27 (×2): qty 500

## 2021-01-27 MED ORDER — TERBUTALINE SULFATE 1 MG/ML IJ SOLN: 0.2500 mg | Freq: Once | INTRAMUSCULAR | Status: DC | PRN

## 2021-01-27 MED ORDER — TETANUS-DIPHTH-ACELL PERTUSSIS 5-2.5-18.5 LF-MCG/0.5 IM SUSY
0.5000 mL | PREFILLED_SYRINGE | Freq: Once | INTRAMUSCULAR | Status: DC
  Filled 2021-01-27: qty 0.5

## 2021-01-27 MED ORDER — BUTORPHANOL TARTRATE 1 MG/ML IJ SOLN: 1.0000 mg | INTRAMUSCULAR | Status: DC | PRN

## 2021-01-27 MED ORDER — SODIUM CHLORIDE 0.9 % IV SOLN
2.0000 g | Freq: Once | INTRAVENOUS | Status: AC
  Administered 2021-01-27: 2 g via INTRAVENOUS
  Filled 2021-01-27: qty 2000

## 2021-01-27 MED ORDER — OXYTOCIN 10 UNIT/ML IJ SOLN
INTRAMUSCULAR | Status: AC
  Filled 2021-01-27: qty 2

## 2021-01-27 MED ORDER — DOCUSATE SODIUM 100 MG PO CAPS
100.0000 mg | ORAL_CAPSULE | Freq: Two times a day (BID) | ORAL | Status: DC
  Administered 2021-01-27: 100 mg via ORAL
  Filled 2021-01-27: qty 1

## 2021-01-27 MED ORDER — ONDANSETRON HCL 4 MG/2ML IJ SOLN: 4.0000 mg | Freq: Four times a day (QID) | INTRAMUSCULAR | Status: DC | PRN

## 2021-01-27 MED ORDER — ZOLPIDEM TARTRATE 5 MG PO TABS: 5.0000 mg | ORAL_TABLET | Freq: Every evening | ORAL | Status: DC | PRN

## 2021-01-27 MED ORDER — LACTATED RINGERS IV SOLN: 500.0000 mL | INTRAVENOUS | Status: DC | PRN

## 2021-01-27 MED ORDER — OXYCODONE-ACETAMINOPHEN 5-325 MG PO TABS: 1.0000 | ORAL_TABLET | ORAL | Status: DC | PRN

## 2021-01-27 MED ORDER — OXYTOCIN-SODIUM CHLORIDE 30-0.9 UT/500ML-% IV SOLN
2.5000 [IU]/h | INTRAVENOUS | Status: DC | PRN
  Administered 2021-01-27: 2.5 [IU]/h via INTRAVENOUS

## 2021-01-27 MED ORDER — PRENATAL MULTIVITAMIN CH
1.0000 | ORAL_TABLET | Freq: Every day | ORAL | Status: DC
  Administered 2021-01-27: 1 via ORAL
  Filled 2021-01-27: qty 1

## 2021-01-27 NOTE — Lactation Note (Signed)
This note was copied from a baby's chart. Lactation Consultation Note  Patient Name: Nicole Kramer [id number] Date: 01/27/2021 Reason for consult: Initial assessment;Term Age:27 hours  Initial lactation visit. Mom is G7P5 SVD 4 hours ago to first baby Nicole. Mom provided breastmilk for 2 of her other children through pumping- cited low supply and latch difficulties. Mom initially latched and breastfed in labor room, but now voices desire to pump and bottle feed to see what baby is getting.   LC talked with mom about normal course of lactation, the consistency of colostrum, baby's effectiveness at colostrum removal vs the pump.  LC describes ways that mom can determine baby is getting enough at the breast: output, signs of contentment, durations between feedings.  Encouraged the first days to be direct breastfeeding for maximum colostrum transfer, options for pumping post feeds to help build supply, and timing of transferring over to pump and bottle feedings. Mom verbalized understanding of education, considered the information, and is going to hold off on pumping/bottles for now, opting to put baby back to breast at next feeding.  LC provided guidance on newborn feeding patterns in first 24 HOL, early cues and importance of feeding with cues, and benefits of skin to skin for both mom and baby. Encouraged rest as possible.  Maternal Data Has patient been taught Hand Expression?: Yes Does the patient have breastfeeding experience prior to this delivery?: Yes How long did the patient breastfeed?: short periods with 2 out of 4 other kids  Feeding Mother's Current Feeding Choice: Breast Milk  LATCH Score                    Lactation Tools Discussed/Used Tools: Pump Breast pump type: Double-Electric Breast Pump Pump Education:  (not given yet) Reason for Pumping: Mom may choose to pump  Interventions Interventions: Breast feeding basics reviewed;Hand  express;DEBP;Education  Discharge    Consult Status Consult Status: Follow-up Date: 01/27/21 Follow-up type: In-patient    Danford Bad 01/27/2021, 1:22 PM
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
## 2021-01-27 NOTE — Progress Notes (Signed)
Pt arrived for scheduled induction of labor. No OB complaints, pt reports rare ctx. VSS, Monitors applied initial NST reactive Cat I, induction medication placed.

## 2021-01-27 NOTE — Progress Notes (Signed)
At 1120 patient called out with complaints of feeling like she needed to pass out while in bathroom. 2 RNs at bedside to assess pt. Patient assisted back to bed by mom and RNs laid patient back and elevated feet. BP obtained at 1123 and was 119/70. At 1125 RN performed fundal exam and uterus was Firm, midline, U/E with 1 clot the size of a softball. Pad weighed and was 115 mL. Patient reported still feeling "funny" and BP retaken at 1128 and was 117/67. Patient given apple juice and left lying back and instructed to call out if feeling worse or like she was going to pass out again.

## 2021-01-28 NOTE — Progress Notes (Signed)
Pt discharged with infant. Discharge instructions, prescriptions, and follow up appointments given to and reviewed with patient. Pt verbalized understanding. To be escorted out by auxillary.  °

## 2021-01-28 NOTE — TOC Transition Note (Signed)
Transition of Care [facility] - CM/SW Discharge Note   Patient Details  Name: RHIAN ASEBEDO MRN: [id number] Date of Birth: 28-Mar-1996  [facility] CM/SW Contact:  Hetty Ely, RN Phone Number: 01/28/2021, 11:20 AM   Clinical Narrative:  Spoke with patient, Fiance and father of the baby at the bedside. Post partum depression discussed with Mom, she says this is her 5th baby, the others are in the home, ages 83,4,5 & 8. She did report having some anxiety afterwards with one of the kids, however did not require medications. Father is in the home, very supportive and agrees to assisting as needed so patient can get rest and self care. Discussed signs of PPD, crying spells, lack of care for baby, not responsive to baby crying, showing little concern about baby needs, any other unusual signs to seek care of PCP. Patient and Fiance voices understanding. Patient and Fiance states they have family support that lives close by. Both feels they have everything needed to care for the baby. Patient voices working at an [place] [employer], will not return for few weeks.          Patient Goals and CMS Choice        Discharge Placement                       Discharge Plan and Services                                     Social Determinants of Health (SDOH) Interventions     Readmission Risk Interventions No flowsheet data found.

## 2021-01-28 NOTE — Discharge Instructions (Signed)
Postpartum Care After Vaginal Delivery The following information offers guidance about how to care for yourself from the time you deliver your baby to 6-12 weeks after delivery (postpartum period). If you have problems or questions, contact your health care provider for more specific instructions. Follow these instructions at home: Vaginal bleeding  It is normal to have vaginal bleeding (lochia) after delivery. Wear a sanitary pad for bleeding and discharge. ? During the first week after delivery, the amount and appearance of lochia is often similar to a menstrual period. ? Over the next few weeks, it will gradually decrease to a dry, yellow-brown discharge. ? For most women, lochia stops completely by 4-6 weeks after delivery, but can vary.  Change your sanitary pads frequently. Watch for any changes in your flow, such as: ? A sudden increase in volume. ? A change in color. ? Large blood clots.  If you pass a blood clot from your vagina, save it and call your health care provider. Do not flush blood clots down the toilet before talking with your health care provider.  Do not use tampons or douches until your health care provider approves.  If you are not breastfeeding, your period should return 6-8 weeks after delivery. If you are feeding your baby breast milk only, your period may not return until you stop breastfeeding. Perineal care  Keep the area between the vagina and the anus (perineum) clean and dry. Use medicated pads and pain-relieving sprays and creams as directed.  If you had a surgical cut in the perineum (episiotomy) or a tear, check the area for signs of infection until you are healed. Check for: ? More redness, swelling, or pain. ? Fluid or blood coming from the cut or tear. ? Warmth. ? Pus or a bad smell.  You may be given a squirt bottle to use instead of wiping to clean the perineum area after you use the bathroom. Pat the area gently to dry it.  To relieve pain  caused by an episiotomy, a tear, or swollen veins in the anus (hemorrhoids), take a warm sitz bath 2-3 times a day. In a sitz bath, the warm water should only come up to your hips and cover your buttocks.   Breast care  In the first few days after delivery, your breasts may feel heavy, full, and uncomfortable (breast engorgement). Milk may also leak from your breasts. Ask your health care provider about ways to help relieve the discomfort.  If you are breastfeeding: ? Wear a bra that supports your breasts and fits well. Use breast pads to absorb milk that leaks. ? Keep your nipples clean and dry. Apply creams and ointments as told. ? You may have uterine contractions every time you breastfeed for up to several weeks after delivery. This helps your uterus return to its normal size. ? If you have any problems with breastfeeding, notify your health care provider or lactation consultant.  If you are not breastfeeding: ? Avoid touching your breasts. Do not squeeze out (express) milk. Doing this can make your breasts produce more milk. ? Wear a good-fitting bra and use cold packs to help with swelling. Intimacy and sexuality  Ask your health care provider when you can engage in sexual activity. This may depend upon: ? Your risk of infection. ? How fast you are healing. ? Your comfort and desire to engage in sexual activity.  You are able to get pregnant after delivery, even if you have not had your period. Talk with   your health care provider about methods of birth control (contraception) or family planning if you desire future pregnancies. Medicines  Take over-the-counter and prescription medicines only as told by your health care provider.  Take an over-the-counter stool softener to help ease bowel movements as told by your health care provider.  If you were prescribed an antibiotic medicine, take it as told by your health care provider. Do not stop taking the antibiotic even if you start to  feel better.  Review all previous and current prescriptions to check for possible transfer into breast milk. Activity  Gradually return to your normal activities as told by your health care provider.  Rest as much as possible. Nap while your baby is sleeping. Eating and drinking  Drink enough fluid to keep your urine pale yellow.  To help prevent or relieve constipation, eat high-fiber foods every day.  Choose healthy eating to support breastfeeding or weight loss goals.  Take your prenatal vitamins until your health care provider tells you to stop.   General tips/recommendations  Do not use any products that contain nicotine or tobacco. These products include cigarettes, chewing tobacco, and vaping devices, such as e-cigarettes. If you need help quitting, ask your health care provider.  Do not drink alcohol, especially if you are breastfeeding.  Do not take medications or drugs that are not prescribed to you, especially if you are breastfeeding.  Visit your health care provider for a postpartum checkup within the first 3-6 weeks after delivery.  Complete a comprehensive postpartum visit no later than 12 weeks after delivery.  Keep all follow-up visits for you and your baby. Contact a health care provider if:  You feel unusually sad or worried.  Your breasts become red, painful, or hard.  You have a fever or other signs of an infection.  You have bleeding that is soaking through one pad an hour or you have blood clots.  You have a severe headache that doesn't go away or you have vision changes.  You have nausea and vomiting and are unable to eat or drink anything for 24 hours. Get help right away if:  You have chest pain or difficulty breathing.  You have sudden, severe leg pain.  You faint or have a seizure.  You have thoughts about hurting yourself or your baby. If you ever feel like you may hurt yourself or others, or have thoughts about taking your own life,  get help right away. Go to your nearest emergency department or:  Call your local emergency services (911 in the [place]  The National Suicide Prevention Lifeline at 1-800-273-8255. This suicide crisis helpline is open 24 hours a day.  Text the Crisis Text Line at 741741 (in the [place] Summary  The period of time after you deliver your newborn up to 6-12 weeks after delivery is called the postpartum period.  Keep all follow-up visits for you and your baby.  Review all previous and current prescriptions to check for possible transfer into breast milk.  Contact a health care provider if you feel unusually sad or worried during the postpartum period. This information is not intended to replace advice given to you by your health care provider. Make sure you discuss any questions you have with your health care provider. Document Revised: 05/06/2020 Document Reviewed: 05/06/2020 Else[employer] Patient Education  2021 [employer] Postpartum Baby Blues The postpartum period begins right after the birth of a baby. During this time, there is often joy and excitement. It is also a   time of many changes in the life of the parents. A mother may feel happy one minute and sad or stressed the next. These feelings of sadness, called the baby blues, usually happen in the period right after the baby is born and go away within a week or two. What are the causes? The exact cause of this condition is not known. Changes in hormone levels after childbirth are believed to trigger some of the symptoms. Other factors that can play a role in these mood changes include:  Lack of sleep.  Stressful life events, such as financial problems, caring for a loved one, or death of a loved one.  Genetics. What are the signs or symptoms? Symptoms of this condition include:  Changes in mood, such as going from extreme happiness to sadness.  A decrease in concentration.  Difficulty sleeping.  Crying spells and  tearfulness.  Loss of appetite.  Irritability.  Anxiety. If these symptoms last for more than 2 weeks or become more severe, you may have postpartum depression. How is this diagnosed? This condition is diagnosed based on an evaluation of your symptoms. Your health care provider may use a screening tool that includes a list of questions to help identify a person with the baby blues or postpartum depression. How is this treated? The baby blues usually go away on their own in 1-2 weeks. Social support is often what is needed. You will be encouraged to get adequate sleep and rest. Follow these instructions at home: Lifestyle  Get as much rest as you can. Take a nap when the baby sleeps.  Exercise regularly as told by your health care provider. Some women find yoga and walking to be helpful.  Eat a balanced and nourishing diet. This includes plenty of fruits and vegetables, whole grains, and lean proteins.  Do little things that you enjoy. Take a bubble bath, read your favorite magazine, or listen to your favorite music.  Avoid alcohol.  Ask for help with household chores, cooking, grocery shopping, or running errands. Do not try to do everything yourself. Consider hiring a postpartum doula to help. This is a professional who specializes in providing support to new mothers.  Try not to make any major life changes during pregnancy or right after giving birth. This can add stress.      General instructions  Talk to people close to you about how you are feeling. Get support from your partner, family members, friends, or other new moms. You may want to join a support group.  Find ways to manage stress. This may include: ? Writing your thoughts and feelings in a journal. ? Spending time outside. ? Spending time with people who make you laugh.  Try to stay positive in how you think. Think about the things you are grateful for.  Take over-the-counter and prescription medicines only as  told by your health care provider.  Let your health care provider know if you have any concerns.  Keep all postpartum visits. This is important. Contact a health care provider if:  Your baby blues do not go away after 2 weeks. Get help right away if:  You have thoughts of taking your own life (suicidal thoughts), or of harming your baby or someone else.  You see or hear things that are not there (hallucinations). If you ever feel like you may hurt yourself or others, or have thoughts about taking your own life, get help right away. Go to your nearest emergency department or:  Call   your local emergency services (911 in the [place]  Call a suicide crisis helpline, such as the National Suicide Prevention Lifeline, at 1-800-273-8255. This is open 24 hours a day in the [place]  Text the Crisis Text Line at 741741 (in the [place] Summary  After giving birth, you may feel happy one minute and sad or stressed the next. Feelings of sadness that happen right after the baby is born and go away after a week or two are called the baby blues.  You can manage the baby blues by getting enough rest, eating a healthy diet, exercising, spending time with supportive people, and finding ways to manage stress.  If feelings of sadness and stress last longer than 2 weeks or get in the way of caring for your baby, talk with your health care provider. This may mean you have postpartum depression. This information is not intended to replace advice given to you by your health care provider. Make sure you discuss any questions you have with your health care provider. Document Revised: 02/13/2020 Document Reviewed: 02/13/2020 Else[employer] Patient Education  2021 [employer] Breastfeeding Tips for a Good Latch Latching is how your baby's mouth attaches to your nipple to breastfeed. It is an important part of breastfeeding. Your baby may have trouble latching for a number of reasons, such as:  Not being in the right  position.  Using a bottle or pacifier too early.  Problems within your baby's mouth, tongue, or lips.  The shape of your nipples.  Your baby being born early (prematurely). Small babies often have a weak suck.  Breasts becoming overfilled with milk (engorged breasts).  Express a little milk to help soften the breast. Work with a breastfeeding specialist (lactation consultant) to help your baby have a good latch. How does this affect me? A poor latch may cause you to have problems such as:  Cracked nipples.  Sore nipples.  Breasts becoming overfilled with milk  Plugged milk ducts.  Low milk supply.  Breast inflammation.  Breast infection. How does this affect my baby? A poor latch may cause your baby to not be able to feed well. As a result, he or she may have trouble gaining weight. Follow these instructions at home: How to position your baby  Find a comfortable place to sit or lie down. Your neck and back should be well supported.  If you are seated, place a pillow or rolled-up blanket under your baby. This will bring him or her to the level of your breast.  Make sure that your baby's belly is facing your belly.  Try different positions to find one that works best for you and your baby. How to help your baby latch  To start, you might find it helpful to gently rub your breast. Move your fingertips in a circle as you massage from your chest wall toward your nipple. This helps milk flow. Keep doing this during feeding if needed.  Position your breast. Hold your breast with four fingers underneath and your thumb above your nipple. Keep your fingers away from your nipple and your baby's mouth. Follow these steps to help your baby latch: 1. Rub your baby's lips gently with your finger or nipple. 2. When your baby's mouth is open wide enough, quickly bring your baby to your breast and place your whole nipple into your baby's mouth. Place as much of the colored area around  your nipple (areola)as possible into your baby's mouth. 3. Your baby's tongue should be between   his or her lower gum and your breast. 4. You should be able to see more areola above your baby's upper lip than below the lower lip. 5. When your baby starts sucking, you will feel a gentle pull on your nipple. You should not feel any pain. Be patient. It is common for a baby to suck for about 2-3 minutes to start the flow of breast milk. 6. Make sure that your baby's mouth is in the right position around your nipple. Your baby's lips should make a seal on your breast and be turned outward.   General instructions  Look for these signs that your baby has latched on to your nipple: ? The baby is quietly tugging or sucking without causing you pain. ? You hear the baby swallow after every 3 or 4 sucks. ? You see movement above and in front of the baby's ears while he or she is sucking.  Be aware of these signs that your baby has not latched on to your nipple: ? The baby makes sucking sounds or smacking sounds while feeding. ? You have nipple pain.  If your baby is not latched well, put your little finger between your baby's gums and your nipple. This will break the seal. Then try to help your baby latch again.  If you need help, get help from a breastfeeding specialist. Contact a doctor if:  You have cracking or soreness in your nipples that lasts longer than 1 week.  You have nipple pain.  Your breasts are filled with too much milk (engorgement), and this does not improve after 48-72 hours.  You have a plugged milk duct and a fever.  You follow the tips for a good latch but need more help.  You have a pus-like fluid coming from your breast.  Your baby is not gaining weight.  Your baby loses weight. Summary  Latching is how your baby's mouth attaches to your nipple to breastfeed.  Try different positions for breastfeeding to find one that works best for you and your baby.  A poor  latch may cause you to have cracked or sore nipples or other problems.  Work with a breastfeeding specialist (lactation consultant) to help your baby have a good latch. This information is not intended to replace advice given to you by your health care provider. Make sure you discuss any questions you have with your health care provider. Document Revised: 02/18/2020 Document Reviewed: 02/18/2020 Else[employer] Patient Education  2021 [employer] Breastfeeding  Choosing to breastfeed is one of the best decisions you can make for yourself and your baby. A change in hormones during pregnancy causes your breasts to make breast milk in your milk-producing glands. Hormones prevent breast milk from being released before your baby is born. They also prompt milk flow after birth. Once breastfeeding has begun, thoughts of your baby, as well as his or her sucking or crying, can stimulate the release of milk from your milk-producing glands. Benefits of breastfeeding Research shows that breastfeeding offers many health benefits for infants and mothers. It also offers a cost-free and convenient way to feed your baby. For your baby  Your first milk (colostrum) helps your baby's digestive system to function better.  Special cells in your milk (antibodies) help your baby to fight off infections.  Breastfed babies are less likely to develop asthma, allergies, obesity, or type 2 diabetes. They are also at lower risk for sudden infant death syndrome (SIDS).  Nutrients in breast milk are better able   to meet your baby's needs compared to infant formula.  Breast milk improves your baby's brain development. For you  Breastfeeding helps to create a very special bond between you and your baby.  Breastfeeding is convenient. Breast milk costs nothing and is always available at the correct temperature.  Breastfeeding helps to burn calories. It helps you to lose the weight that you gained during  pregnancy.  Breastfeeding makes your uterus return faster to its size before pregnancy. It also slows bleeding (lochia) after you give birth.  Breastfeeding helps to lower your risk of developing type 2 diabetes, osteoporosis, rheumatoid arthritis, cardiovascular disease, and breast, ovarian, uterine, and endometrial cancer later in life. Breastfeeding basics Starting breastfeeding  Find a comfortable place to sit or lie down, with your neck and back well-supported.  Place a pillow or a rolled-up blanket under your baby to bring him or her to the level of your breast (if you are seated). Nursing pillows are specially designed to help support your arms and your baby while you breastfeed.  Make sure that your baby's tummy (abdomen) is facing your abdomen.  Gently massage your breast. With your fingertips, massage from the outer edges of your breast inward toward the nipple. This encourages milk flow. If your milk flows slowly, you may need to continue this action during the feeding.  Support your breast with 4 fingers underneath and your thumb above your nipple (make the letter "C" with your hand). Make sure your fingers are well away from your nipple and your baby's mouth.  Stroke your baby's lips gently with your finger or nipple.  When your baby's mouth is open wide enough, quickly bring your baby to your breast, placing your entire nipple and as much of the areola as possible into your baby's mouth. The areola is the colored area around your nipple. ? More areola should be visible above your baby's upper lip than below the lower lip. ? Your baby's lips should be opened and extended outward (flanged) to ensure an adequate, comfortable latch. ? Your baby's tongue should be between his or her lower gum and your breast.  Make sure that your baby's mouth is correctly positioned around your nipple (latched). Your baby's lips should create a seal on your breast and be turned out (everted).  It  is common for your baby to suck about 2-3 minutes in order to start the flow of breast milk. Latching Teaching your baby how to latch onto your breast properly is very important. An improper latch can cause nipple pain, decreased milk supply, and poor weight gain in your baby. Also, if your baby is not latched onto your nipple properly, he or she may swallow some air during feeding. This can make your baby fussy. Burping your baby when you switch breasts during the feeding can help to get rid of the air. However, teaching your baby to latch on properly is still the best way to prevent fussiness from swallowing air while breastfeeding. Signs that your baby has successfully latched onto your nipple  Silent tugging or silent sucking, without causing you pain. Infant's lips should be extended outward (flanged).  Swallowing heard between every 3-4 sucks once your milk has started to flow (after your let-down milk reflex occurs).  Muscle movement above and in front of his or her ears while sucking. Signs that your baby has not successfully latched onto your nipple  Sucking sounds or smacking sounds from your baby while breastfeeding.  Nipple pain. If you think   your baby has not latched on correctly, slip your finger into the corner of your baby's mouth to break the suction and place it between your baby's gums. Attempt to start breastfeeding again. Signs of successful breastfeeding Signs from your baby  Your baby will gradually decrease the number of sucks or will completely stop sucking.  Your baby will fall asleep.  Your baby's body will relax.  Your baby will retain a small amount of milk in his or her mouth.  Your baby will let go of your breast by himself or herself. Signs from you  Breasts that have increased in firmness, weight, and size 1-3 hours after feeding.  Breasts that are softer immediately after breastfeeding.  Increased milk volume, as well as a change in milk consistency  and color by the fifth day of breastfeeding.  Nipples that are not sore, cracked, or bleeding. Signs that your baby is getting enough milk  Wetting at least 1-2 diapers during the first 24 hours after birth.  Wetting at least 5-6 diapers every 24 hours for the first week after birth. The urine should be clear or pale yellow by the age of 5 days.  Wetting 6-8 diapers every 24 hours as your baby continues to grow and develop.  At least 3 stools in a 24-hour period by the age of 5 days. The stool should be soft and yellow.  At least 3 stools in a 24-hour period by the age of 7 days. The stool should be seedy and yellow.  No loss of weight greater than 10% of birth weight during the first 3 days of life.  Average weight gain of 4-7 oz (113-198 g) per week after the age of 4 days.  Consistent daily weight gain by the age of 5 days, without weight loss after the age of 2 weeks. After a feeding, your baby may spit up a small amount of milk. This is normal. Breastfeeding frequency and duration Frequent feeding will help you make more milk and can prevent sore nipples and extremely full breasts (breast engorgement). Breastfeed when you feel the need to reduce the fullness of your breasts or when your baby shows signs of hunger. This is called "breastfeeding on demand." Signs that your baby is hungry include:  Increased alertness, activity, or restlessness.  Movement of the head from side to side.  Opening of the mouth when the corner of the mouth or cheek is stroked (rooting).  Increased sucking sounds, smacking lips, cooing, sighing, or squeaking.  Hand-to-mouth movements and sucking on fingers or hands.  Fussing or crying. Avoid introducing a pacifier to your baby in the first 4-6 weeks after your baby is born. After this time, you may choose to use a pacifier. Research has shown that pacifier use during the first year of a baby's life decreases the risk of sudden infant death syndrome  (SIDS). Allow your baby to feed on each breast as long as he or she wants. When your baby unlatches or falls asleep while feeding from the first breast, offer the second breast. Because newborns are often sleepy in the first few weeks of life, you may need to awaken your baby to get him or her to feed. Breastfeeding times will vary from baby to baby. However, the following rules can serve as a guide to help you make sure that your baby is properly fed:  Newborns (babies 4 weeks of age or younger) may breastfeed every 1-3 hours.  Newborns should not go without breastfeeding   for longer than 3 hours during the day or 5 hours during the night.  You should breastfeed your baby a minimum of 8 times in a 24-hour period. Breast milk pumping Pumping and storing breast milk allows you to make sure that your baby is exclusively fed your breast milk, even at times when you are unable to breastfeed. This is especially important if you go back to work while you are still breastfeeding, or if you are not able to be present during feedings. Your lactation consultant can help you find a method of pumping that works best for you and give you guidelines about how long it is safe to store breast milk.      Caring for your breasts while you breastfeed Nipples can become dry, cracked, and sore while breastfeeding. The following recommendations can help keep your breasts moisturized and healthy:  Avoid using soap on your nipples.  Wear a supportive bra designed especially for nursing. Avoid wearing underwire-style bras or extremely tight bras (sports bras).  Air-dry your nipples for 3-4 minutes after each feeding.  Use only cotton bra pads to absorb leaked breast milk. Leaking of breast milk between feedings is normal.  Use lanolin on your nipples after breastfeeding. Lanolin helps to maintain your skin's normal moisture barrier. Pure lanolin is not harmful (not toxic) to your baby. You may also hand express a few  drops of breast milk and gently massage that milk into your nipples and allow the milk to air-dry. In the first few weeks after giving birth, some women experience breast engorgement. Engorgement can make your breasts feel heavy, warm, and tender to the touch. Engorgement peaks within 3-5 days after you give birth. The following recommendations can help to ease engorgement:  Completely empty your breasts while breastfeeding or pumping. You may want to start by applying warm, moist heat (in the shower or with warm, water-soaked hand towels) just before feeding or pumping. This increases circulation and helps the milk flow. If your baby does not completely empty your breasts while breastfeeding, pump any extra milk after he or she is finished.  Apply ice packs to your breasts immediately after breastfeeding or pumping, unless this is too uncomfortable for you. To do this: ? Put ice in a plastic bag. ? Place a towel between your skin and the bag. ? Leave the ice on for 20 minutes, 2-3 times a day.  Make sure that your baby is latched on and positioned properly while breastfeeding. If engorgement persists after 48 hours of following these recommendations, contact your health care provider or a lactation consultant. Overall health care recommendations while breastfeeding  Eat 3 healthy meals and 3 snacks every day. Well-nourished mothers who are breastfeeding need an additional 450-500 calories a day. You can meet this requirement by increasing the amount of a balanced diet that you eat.  Drink enough water to keep your urine pale yellow or clear.  Rest often, relax, and continue to take your prenatal vitamins to prevent fatigue, stress, and low vitamin and mineral levels in your body (nutrient deficiencies).  Do not use any products that contain nicotine or tobacco, such as cigarettes and e-cigarettes. Your baby may be harmed by chemicals from cigarettes that pass into breast milk and exposure to  secondhand smoke. If you need help quitting, ask your health care provider.  Avoid alcohol.  Do not use illegal drugs or marijuana.  Talk with your health care provider before taking any medicines. These include over-the-counter and prescription   medicines as well as vitamins and herbal supplements. Some medicines that may be harmful to your baby can pass through breast milk.  It is possible to become pregnant while breastfeeding. If birth control is desired, ask your health care provider about options that will be safe while breastfeeding your baby. Where to find more information: [employer]: www.llli.org Contact a health care provider if:  You feel like you want to stop breastfeeding or have become frustrated with breastfeeding.  Your nipples are cracked or bleeding.  Your breasts are red, tender, or warm.  You have: ? Painful breasts or nipples. ? A swollen area on either breast. ? A fever or chills. ? Nausea or vomiting. ? Drainage other than breast milk from your nipples.  Your breasts do not become full before feedings by the fifth day after you give birth.  You feel sad and depressed.  Your baby is: ? Too sleepy to eat well. ? Having trouble sleeping. ? More than 1 week old and wetting fewer than 6 diapers in a 24-hour period. ? Not gaining weight by 5 days of age.  Your baby has fewer than 3 stools in a 24-hour period.  Your baby's skin or the white parts of his or her eyes become yellow. Get help right away if:  Your baby is overly tired (lethargic) and does not want to wake up and feed.  Your baby develops an unexplained fever. Summary  Breastfeeding offers many health benefits for infant and mothers.  Try to breastfeed your infant when he or she shows early signs of hunger.  Gently tickle or stroke your baby's lips with your finger or nipple to allow the baby to open his or her mouth. Bring the baby to your breast. Make sure that much of  the areola is in your baby's mouth. Offer one side and burp the baby before you offer the other side.  Talk with your health care provider or lactation consultant if you have questions or you face problems as you breastfeed. This information is not intended to replace advice given to you by your health care provider. Make sure you discuss any questions you have with your health care provider. Document Revised: 11/15/2017 Document Reviewed: 09/22/2016 Elsevier Patient Education  2021 Elsevier Inc. Breast Pumping Tips Breast pumping is a way to get milk out of your breasts. You will then store the milk for your baby to use when you are away from home. There are three ways to pump.  You can use your hand to massage and squeeze your breast (hand expression).  You can use a hand-held machine to manually pump your milk.  You can use an electric machine to pump your milk. In the beginning you may not get much milk. After a few days, your breasts should make more. Pumping can help you start making milk after your baby is born. Pumping helps you to keep making milk when you are away from your baby. When should I pump? You can start pumping soon after your baby is born. Follow these tips:  When you are with your baby: ? Pump after you breastfeed. ? Pump from the free breast while you breastfeed.  When you are away from your baby: ? Pump every 2-3 hours for 15 minutes. ? Pump both breasts at the same time if you can.  If your baby drinks formula, pump around the time your baby gets the formula.  If you drank alcohol, wait 2 hours before you   pump.  If you are going to have surgery, ask your doctor when you should pump again. How do I get ready to pump? Try to relax. Try these things to help your milk come in:  Smell your baby's blanket or clothes.  Look at a picture or video of your baby.  Sit in a quiet, private space.  Place a cloth on your breast. The cloth should be warm and a little  wet.  Massage your breast and nipple.  Play relaxing music.  Picture your milk flowing.  Drink water and eat a snack. What are some tips? General tips for pumping breast milk  Always wash your hands with soap and water for at least 20 seconds before pumping.  If you do not get much milk or if pumping hurts, try different pump settings or a different kind of pump.  Drink enough fluid so your pee (urine) is clear or pale yellow.  Wear clothing that opens in the front or is easy to take off.  Pump milk into a clean bottle or container.  Do not smoke or use any products that contain nicotine or tobacco. If you need help quitting, ask your doctor.  Try to get a hands-free pumping bra, if possible. This makes it easy to pump breast milk. You can buy one or make your own.   Tips for storing breast milk  Store breast milk in a clean, BPA-free container. These include: ? A glass or plastic bottle. ? A milk storage bag.  Store only 2-4 ounces of breast milk in each container.  Swirl the breast milk in the container. Do not shake it.  Write down the date you pumped the milk on the container.  This is how long you can store breast milk: ? Room temperature: 6-8 hours. It is best to use the milk within 4 hours. ? Cooler with ice packs: 24 hours. ? Refrigerator: 5-8 days, if the milk is clean. It is best to use the milk within 3 days. ? Freezer: 9-12 months, if the milk is clean and stored away from the freezer door. It is best to use the milk within 6 months.  Put milk in the back of the refrigerator or freezer.  Thaw frozen milk using warm water. Do not use the microwave.   Tips for choosing a breast pump When choosing a pump, keep the following things in mind:  Manual breast pumps do not need electricity. They cost less. They can be hard to use.  Electric breast pumps use electricity. They are more expensive. They are easier to use. They collect more milk.  The suction cup  (flange) should be the right size.  Before you buy the pump, check if your insurance will pay for it. Tips for caring for a breast pump  Check the manual that came with your pump for cleaning tips.  Try not to touch the inside of pump parts.  Clean the pump after you use it. To do this: ? Wipe down the electrical part. Use a dry cloth or paper towel. Do not put this part in water or in cleaning products. ? Wash the plastic parts with soap and warm water. Or use the dishwasher if the manual says it is safe. You do not need to clean the tubing unless it touched breast milk. ? Let all the parts air dry. Avoid drying them with a cloth or towel. ? When the parts are clean and dry, put the pump back together. Then   store the pump.  If there is water in the tubing when you want to pump: 1. Attach the tubing to the pump. 2. Turn on the pump to dry the tubing. 3. Turn off the pump when the tube is dry. Summary  Pumping can help you start making milk after your baby is born. It lets you keep making milk when you are away from your baby.  When you are away from your baby, pump for about 15 minutes every 2-3 hours. Pump both breasts at the same time, if you can. This information is not intended to replace advice given to you by your health care provider. Make sure you discuss any questions you have with your health care provider. Document Revised: 06/01/2020 Document Reviewed: 06/01/2020 Elsevier Patient Education  2021 Elsevier Inc.  

## 2021-01-28 NOTE — Lactation Note (Signed)
This note was copied from a baby's chart. Lactation Consultation Note  Patient Name: Nicole Kramer [id number] Date: 01/28/2021 Reason for consult: Follow-up assessment;Term Age:27 hours For d/c this am Maternal Data Has patient been taught Hand Expression?: Yes Does the patient have breastfeeding experience prior to this delivery?: Yes How long did the patient breastfeed?: 6 mths with first  Feeding Mother's Current Feeding Choice: Breast Milk Baby rooting, latched easily in football hold on right breast, mom shown how to get deep latch by shaping breast, baby tends to latch to tip of nipple, occ swallow noted, nursed 20 min on right, mom encouraged to attempt on left also, states she hears more swallows on left and baby latches easier on left , encouraged frequent feedings, at least 8 feedings in 24 hrs LATCH Score Latch: Grasps breast easily, tongue down, lips flanged, rhythmical sucking.  Audible Swallowing: A few with stimulation  Type of Nipple: Everted at rest and after stimulation  Comfort (Breast/Nipple): Soft / non-tender  Hold (Positioning): No assistance needed to correctly position infant at breast.  LATCH Score: 9   Lactation Tools Discussed/Used   [facility] name and no written on white board Interventions Interventions: Hand express;Adjust position;Support pillows;Coconut oil;Education  Discharge Discharge Education: Engorgement and breast care;Warning signs for feeding baby Pump: Personal WIC Program: No  Consult Status Consult Status: Complete Date: 01/28/21 Follow-up type: In-patient    Dyann Kief 01/28/2021, 11:08 AM

## 2021-01-28 NOTE — Discharge Summary (Signed)
Patient Name: Nicole Kramer DOB: 1995-11-02 MRN: [id number]                            Discharge Summary  Date of Admission: 01/27/2021 Date of Discharge: 01/28/2021 Delivering Provider: Adelene Idler R   Admitting Diagnosis: Post-dates pregnancy [O48.0] at [redacted]w[redacted]d Secondary diagnosis:  Active Problems:   Post-dates pregnancy   Mode of Delivery: normal spontaneous vaginal delivery              Discharge diagnosis: Term Pregnancy Delivered      Intrapartum Procedures: Atificial rupture of membranes   Post partum procedures:   Complications: none                     Discharge Day SOAP Note:  Progress Note - Vaginal Delivery  Nicole Kramer is a 25 y.o. [id number] now PP day 1 s/p Vaginal, Spontaneous . Delivery was uncomplicated  Subjective  The patient has the following complaints: has no unusual complaints  Pain is controlled with current medications.   Patient is urinating without difficulty.  She is ambulating well.   Strongly desires discharge. Reaffirmed her desire for permanent sterilization - papers signed.  Objective  Vital signs: BP 115/74 (BP Location: Right Arm)   Pulse 67   Temp 98.5 F (36.9 C) (Oral)   Resp 16   Ht 5\' 7"  (1.702 m)   Wt 89.4 kg   LMP 04/28/2020 (Approximate) Comment: lasted only 2 days. last normal period 03/28/20  SpO2 99%   Breastfeeding Unknown   BMI 30.85 kg/m   Physical Exam: Gen: NAD Fundus Fundal Tone: Firm  Lochia Amount: Scant        Data Review Labs: Lab Results  Component Value Date   WBC 10.9 (H) 01/27/2021   HGB 12.1 01/27/2021   HCT 35.7 (L) 01/27/2021   MCV 96.2 01/27/2021   PLT 199 01/27/2021   CBC Latest Ref Rng & Units 01/27/2021 11/02/2020 06/23/2020  WBC 4.0 - 10.5 K/uL 10.9(H) 9.4 6.8  Hemoglobin 12.0 - 15.0 g/dL 06/25/2020 56.2 56.3  Hematocrit 36.0 - 46.0 % 35.7(L) 32.3(L) 36.6  Platelets 150 - 400 K/uL 199 172 205   A POS  Edinburgh Score: Edinburgh Postnatal Depression Scale Screening  Tool 01/27/2021  I have been able to laugh and see the funny side of things. 1  I have looked forward with enjoyment to things. 1  I have blamed myself unnecessarily when things went wrong. 2  I have been anxious or worried for no good reason. 3  I have felt scared or panicky for no good reason. 2  Things have been getting on top of me. 1  I have been so unhappy that I have had difficulty sleeping. 2  I have felt sad or miserable. 1  I have been so unhappy that I have been crying. 1  The thought of harming myself has occurred to me. 0  Edinburgh Postnatal Depression Scale Total 14    Assessment/Plan  Active Problems:   Post-dates pregnancy    Plan for discharge today.  Discharge Instructions: Per After Visit Summary. Activity: Advance as tolerated. Pelvic rest for 6 weeks.  Also refer to After Visit Summary Diet: Regular Medications: Allergies as of 01/28/2021      Reactions   Doxycycline Rash      Medication List    STOP taking these medications   valACYclovir 500 MG tablet  Commonly known as: VALTREX     TAKE these medications   multivitamin-prenatal 27-0.8 MG Tabs tablet Take 1 tablet by mouth daily at 12 noon.      Outpatient follow up:   Follow-up Information    Linzie Collin, MD Follow up in 3 week(s).   Specialties: Obstetrics and Gynecology, Radiology Contact information: [address] [place] [address] [phone]              Postpartum contraception: Will discuss at first office visit post-partum  Discharged Condition: good  Discharged to: home  Newborn Data: Disposition:home with mother  Apgars: APGAR (1 MIN): 8   APGAR (5 MINS): 9   APGAR (10 MINS):    Baby Feeding: Breast    Elonda Husky, M.D. 01/28/2021 9:45 AM

## 2021-01-30 LAB — TYPE AND SCREEN
ABO/RH(D): A POS
Antibody Screen: POSITIVE
Unit division: 0
Unit division: 0

## 2021-01-30 LAB — BPAM RBC
Blood Product Expiration Date: 202206162359
Blood Product Expiration Date: 202206222359
Unit Type and Rh: 6200
Unit Type and Rh: 6200

## 2021-02-17 ENCOUNTER — Other Ambulatory Visit: Payer: Self-pay

## 2021-02-17 ENCOUNTER — Encounter: Payer: Self-pay | Admitting: Obstetrics and Gynecology

## 2021-02-17 ENCOUNTER — Ambulatory Visit (INDEPENDENT_AMBULATORY_CARE_PROVIDER_SITE_OTHER): Payer: Medicaid Other | Admitting: Obstetrics and Gynecology

## 2021-02-17 VITALS — BP 117/80 | HR 87 | Ht 67.0 in | Wt 186.5 lb

## 2021-02-17 DIAGNOSIS — Z01818 Encounter for other preprocedural examination: Secondary | ICD-10-CM

## 2021-02-17 DIAGNOSIS — F53 Postpartum depression: Secondary | ICD-10-CM

## 2021-02-17 DIAGNOSIS — O99345 Other mental disorders complicating the puerperium: Secondary | ICD-10-CM

## 2021-02-17 DIAGNOSIS — Z3009 Encounter for other general counseling and advice on contraception: Secondary | ICD-10-CM

## 2021-02-17 MED ORDER — SERTRALINE HCL 50 MG PO TABS: 50.0000 mg | ORAL_TABLET | Freq: Every day | ORAL | 1 refills | Status: DC
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
## 2021-02-25 ENCOUNTER — Other Ambulatory Visit
Admission: RE | Admit: 2021-02-25 | Discharge: 2021-02-25 | Disposition: A | Discharge status: Home / Self Care | Payer: Medicaid Other | Source: Ambulatory Visit | Attending: Obstetrics and Gynecology | Admitting: Obstetrics and Gynecology

## 2021-02-25 NOTE — Patient Instructions (Signed)
Your procedure is scheduled on: March 04, 2021 Friday  Report to the Registration Desk on the 1st floor of the [facility] To find out your arrival time, please call [phone] between 1PM - 3PM on: March 03, 2021 THURSDAY  REMEMBER: Instructions that are not followed completely may result in serious medical risk, up to and including death; or upon the discretion of your surgeon and anesthesiologist your surgery may need to be rescheduled.  Do not eat food after midnight the night before surgery.  No gum chewing, lozengers or hard candies.  You may however, drink CLEAR liquids up to 2 hours before you are scheduled to arrive for your surgery. Do not drink anything within 2 hours of your scheduled arrival time.  Clear liquids include: - water  - apple juice without pulp - gatorade (not RED, PURPLE, OR BLUE) - black coffee or tea (Do NOT add milk or creamers to the coffee or tea) Do NOT drink anything that is not on this list.  TAKE THESE MEDICATIONS THE MORNING OF SURGERY WITH A SIP OF WATER:  (take one the night before and one on the morning of surgery - helps to prevent nausea after surgery.)  One week prior to surgery: Stop Anti-inflammatories (NSAIDS) such as Advil, Aleve, Ibuprofen, Motrin, Naproxen, Naprosyn and ASPIRIN OR Aspirin based products such as Excedrin, Goodys Powder, BC Powder. Stop ANY OVER THE COUNTER supplements until after surgery. You may however, continue to take Tylenol if needed for pain up until the day of surgery.  No Alcohol for 24 hours before or after surgery.  No Smoking including e-cigarettes for 24 hours prior to surgery.  No chewable tobacco products for at least 6 hours prior to surgery.  No nicotine patches on the day of surgery.  Do not use any "recreational" drugs for at least a week prior to your surgery.  Please be advised that the combination of cocaine and anesthesia may have negative outcomes, up to and including death. If you test  positive for cocaine, your surgery will be cancelled.  On the morning of surgery brush your teeth with toothpaste and water, you may rinse your mouth with mouthwash if you wish. Do not swallow any toothpaste or mouthwash.  Do not wear jewelry, make-up, hairpins, clips or nail polish.  Do not wear lotions, powders, or perfumes.   Do not shave body from the neck down 48 hours prior to surgery just in case you cut yourself which could leave a site for infection.  Also, freshly shaved skin may become irritated if using the CHG soap.  Contact lenses, hearing aids and dentures may not be worn into surgery.  Do not bring valuables to the hospital. [facility] is not responsible for any missing/lost belongings or valuables.   Use CHG Soap as directed on instruction sheet.   Notify your doctor if there is any change in your medical condition (cold, fever, infection).  Wear comfortable clothing (specific to your surgery type) to the hospital.  After surgery, you can help prevent lung complications by doing breathing exercises.  Take deep breaths and cough every 1-2 hours. Your doctor may order a device called an Incentive Spirometer to help you take deep breaths. When coughing or sneezing, hold a pillow firmly against your incision with both hands. This is called "splinting." Doing this helps protect your incision. It also decreases belly discomfort.  If you are being admitted to the hospital overnight, leave your suitcase in the car. After surgery it  may be brought to your room.  If you are being discharged the day of surgery, you will not be allowed to drive home. You will need a responsible adult (18 years or older) to drive you home and stay with you that night.   Please call the Pre-admissions Testing Dept. at [phone] if you have any questions about these instructions.  Surgery Visitation Policy:  Patients undergoing a surgery or procedure may have one family member or support  person with them as long as that person is not COVID-19 positive or experiencing its symptoms.  That person may remain in the waiting area during the procedure. Masking is required regardless of vaccination status.

## 2021-03-01 ENCOUNTER — Other Ambulatory Visit
Admission: RE | Admit: 2021-03-01 | Discharge: 2021-03-01 | Disposition: A | Discharge status: Home / Self Care | Payer: Medicaid Other | Source: Ambulatory Visit | Attending: Obstetrics and Gynecology | Admitting: Obstetrics and Gynecology

## 2021-03-01 ENCOUNTER — Other Ambulatory Visit: Payer: Self-pay

## 2021-03-01 HISTORY — DX: Anxiety disorder, unspecified: F41.9

## 2021-03-01 HISTORY — DX: Depression, unspecified: F32.A

## 2021-03-01 NOTE — Patient Instructions (Addendum)
Your procedure is scheduled on: 03/04/21 - Friday Report to the Registration Desk on the 1st floor of [facility] To find out your arrival time, please call [phone] between 1PM - 3PM on: 03/03/21 - Thursday Report to [facility] on 03/02/21 for Labs   REMEMBER: Instructions that are not followed completely may result in serious medical risk, up to and including death; or upon the discretion of your surgeon and anesthesiologist your surgery may need to be rescheduled.  Do not eat food after midnight the night before surgery.  No gum chewing, lozengers or hard candies.  You may however, drink CLEAR liquids up to 2 hours before you are scheduled to arrive for your surgery. Do not drink anything within 2 hours of your scheduled arrival time.  Clear liquids include: - water  - apple juice without pulp - gatorade (not RED, PURPLE, OR BLUE) - black coffee or tea (Do NOT add milk or creamers to the coffee or tea) Do NOT drink anything that is not on this list.  TAKE THESE MEDICATIONS THE MORNING OF SURGERY WITH A SIP OF WATER: - sertraline (ZOLOFT) 50 MG tablet  One week prior to surgery: Stop Anti-inflammatories (NSAIDS) such as Advil, Aleve, Ibuprofen, Motrin, Naproxen, Naprosyn and Aspirin based products such as Excedrin, Goodys Powder, BC Powder.  Stop ANY OVER THE COUNTER supplements until after surgery.  You may  take Tylenol if needed for pain up until the day of surgery.  No Alcohol for 24 hours before or after surgery.  No Smoking including e-cigarettes for 24 hours prior to surgery.  No chewable tobacco products for at least 6 hours prior to surgery.  No nicotine patches on the day of surgery.  Do not use any "recreational" drugs for at least a week prior to your surgery.  Please be advised that the combination of cocaine and anesthesia may have negative outcomes, up to and including death. If you test positive for cocaine, your surgery will be cancelled.  On  the morning of surgery brush your teeth with toothpaste and water, you may rinse your mouth with mouthwash if you wish. Do not swallow any toothpaste or mouthwash.  Do not wear jewelry, make-up, hairpins, clips or nail polish.  Do not wear lotions, powders, or perfumes.   Do not shave body from the neck down 48 hours prior to surgery just in case you cut yourself which could leave a site for infection.  Also, freshly shaved skin may become irritated if using the CHG soap.  Contact lenses, hearing aids and dentures may not be worn into surgery.  Do not bring valuables to the hospital. [facility] is not responsible for any missing/lost belongings or valuables.   Use CHG Soap or wipes as directed on instruction sheet.  Notify your doctor if there is any change in your medical condition (cold, fever, infection).  Wear comfortable clothing (specific to your surgery type) to the hospital.  After surgery, you can help prevent lung complications by doing breathing exercises.  Take deep breaths and cough every 1-2 hours. Your doctor may order a device called an Incentive Spirometer to help you take deep breaths. When coughing or sneezing, hold a pillow firmly against your incision with both hands. This is called "splinting." Doing this helps protect your incision. It also decreases belly discomfort.  If you are being admitted to the hospital overnight, leave your suitcase in the car. After surgery it may be brought to your room.  If you  are being discharged the day of surgery, you will not be allowed to drive home. You will need a responsible adult (18 years or older) to drive you home and stay with you that night.   If you are taking public transportation, you will need to have a responsible adult (18 years or older) with you. Please confirm with your physician that it is acceptable to use public transportation.   Please call the Pre-admissions Testing Dept. at [phone] if you have  any questions about these instructions.  Surgery Visitation Policy:  Patients undergoing a surgery or procedure may have one family member or support person with them as long as that person is not COVID-19 positive or experiencing its symptoms.  That person may remain in the waiting area during the procedure.  Inpatient Visitation:    Visiting hours are 7 a.m. to 8 p.m. Inpatients will be allowed two visitors daily. The visitors may change each day during the patient's stay. No visitors under the age of 80. Any visitor under the age of 73 must be accompanied by an adult. The visitor must pass COVID-19 screenings, use hand sanitizer when entering and exiting the patient's room and wear a mask at all times, including in the patient's room. Patients must also wear a mask when staff or their visitor are in the room. Masking is required regardless of vaccination status.

## 2021-03-02 ENCOUNTER — Other Ambulatory Visit: Payer: Medicaid Other

## 2021-03-02 ENCOUNTER — Encounter
Admission: RE | Admit: 2021-03-02 | Discharge: 2021-03-02 | Disposition: A | Discharge status: Home / Self Care | Payer: Medicaid Other | Source: Ambulatory Visit | Attending: Obstetrics and Gynecology | Admitting: Obstetrics and Gynecology

## 2021-03-02 DIAGNOSIS — Z01812 Encounter for preprocedural laboratory examination: Secondary | ICD-10-CM | POA: Diagnosis present

## 2021-03-02 LAB — CBC
HCT: 36.1 % (ref 36.0–46.0)
Hemoglobin: 12.1 g/dL (ref 12.0–15.0)
MCH: 32.6 pg (ref 26.0–34.0)
MCHC: 33.5 g/dL (ref 30.0–36.0)
MCV: 97.3 fL (ref 80.0–100.0)
Platelets: 209 10*3/uL (ref 150–400)
RBC: 3.71 MIL/uL — ABNORMAL LOW (ref 3.87–5.11)
RDW: 13.1 % (ref 11.5–15.5)
WBC: 7.4 10*3/uL (ref 4.0–10.5)
nRBC: 0 % (ref 0.0–0.2)

## 2021-03-02 LAB — HCG, QUANTITATIVE, PREGNANCY: hCG, Beta Chain, Quant, S: 2 m[IU]/mL (ref <5)

## 2021-03-04 ENCOUNTER — Encounter: Payer: Self-pay | Admitting: Obstetrics and Gynecology

## 2021-03-04 ENCOUNTER — Other Ambulatory Visit: Payer: Self-pay

## 2021-03-04 ENCOUNTER — Encounter
Admission: RE | Disposition: A | Discharge status: Home / Self Care | Payer: Self-pay | Source: Home / Self Care | Attending: Obstetrics and Gynecology

## 2021-03-04 ENCOUNTER — Ambulatory Visit
Admission: RE | Admit: 2021-03-04 | Discharge: 2021-03-04 | Disposition: A | Discharge status: Home / Self Care | Payer: Medicaid Other | Attending: Obstetrics and Gynecology | Admitting: Obstetrics and Gynecology

## 2021-03-04 ENCOUNTER — Ambulatory Visit: Payer: Medicaid Other | Admitting: Anesthesiology

## 2021-03-04 DIAGNOSIS — Z87440 Personal history of urinary (tract) infections: Secondary | ICD-10-CM | POA: Diagnosis not present

## 2021-03-04 DIAGNOSIS — Z881 Allergy status to other antibiotic agents status: Secondary | ICD-10-CM | POA: Diagnosis not present

## 2021-03-04 DIAGNOSIS — O99345 Other mental disorders complicating the puerperium: Secondary | ICD-10-CM | POA: Diagnosis not present

## 2021-03-04 DIAGNOSIS — O99335 Smoking (tobacco) complicating the puerperium: Secondary | ICD-10-CM | POA: Insufficient documentation

## 2021-03-04 DIAGNOSIS — Z302 Encounter for sterilization: Secondary | ICD-10-CM | POA: Diagnosis present

## 2021-03-04 DIAGNOSIS — F17210 Nicotine dependence, cigarettes, uncomplicated: Secondary | ICD-10-CM | POA: Diagnosis not present

## 2021-03-04 DIAGNOSIS — Z79899 Other long term (current) drug therapy: Secondary | ICD-10-CM | POA: Diagnosis not present

## 2021-03-04 DIAGNOSIS — F53 Postpartum depression: Secondary | ICD-10-CM | POA: Insufficient documentation

## 2021-03-04 HISTORY — PX: LAPAROSCOPIC BILATERAL SALPINGECTOMY: SHX5889

## 2021-03-04 LAB — POCT PREGNANCY, URINE: Preg Test, Ur: NEGATIVE

## 2021-03-04 SURGERY — SALPINGECTOMY, BILATERAL, LAPAROSCOPIC
Anesthesia: General | Laterality: Bilateral

## 2021-03-04 MED ORDER — LACTATED RINGERS IV SOLN: INTRAVENOUS | Status: DC

## 2021-03-04 MED ORDER — OXYCODONE HCL 5 MG PO TABS
ORAL_TABLET | ORAL | Status: AC
  Filled 2021-03-04: qty 1

## 2021-03-04 MED ORDER — ACETAMINOPHEN 10 MG/ML IV SOLN
INTRAVENOUS | Status: DC | PRN
  Administered 2021-03-04: 1000 mg via INTRAVENOUS

## 2021-03-04 MED ORDER — MIDAZOLAM HCL 2 MG/2ML IJ SOLN
INTRAMUSCULAR | Status: AC
  Filled 2021-03-04: qty 2

## 2021-03-04 MED ORDER — LACTATED RINGERS IV SOLN: INTRAVENOUS | Status: DC | PRN

## 2021-03-04 MED ORDER — ONDANSETRON HCL 4 MG/2ML IJ SOLN
INTRAMUSCULAR | Status: DC | PRN
  Administered 2021-03-04: 4 mg via INTRAVENOUS

## 2021-03-04 MED ORDER — MEPERIDINE HCL 25 MG/ML IJ SOLN
6.2500 mg | INTRAMUSCULAR | Status: DC | PRN
Start: 2021-03-04 — End: 2021-03-04
  Administered 2021-03-04: 12.5 mg via INTRAVENOUS

## 2021-03-04 MED ORDER — VASOPRESSIN 20 UNIT/ML IV SOLN
INTRAVENOUS | Status: AC
  Filled 2021-03-04: qty 1

## 2021-03-04 MED ORDER — PROPOFOL 10 MG/ML IV BOLUS
INTRAVENOUS | Status: DC | PRN
  Administered 2021-03-04: 150 mg via INTRAVENOUS

## 2021-03-04 MED ORDER — MEPERIDINE HCL 25 MG/ML IJ SOLN
INTRAMUSCULAR | Status: AC
  Filled 2021-03-04: qty 1

## 2021-03-04 MED ORDER — FENTANYL CITRATE (PF) 100 MCG/2ML IJ SOLN
INTRAMUSCULAR | Status: DC | PRN
  Administered 2021-03-04 (×2): 50 ug via INTRAVENOUS

## 2021-03-04 MED ORDER — POVIDONE-IODINE 10 % EX SWAB
2.0000 "application " | Freq: Once | CUTANEOUS | Status: AC
  Administered 2021-03-04: 2 via TOPICAL

## 2021-03-04 MED ORDER — CHLORHEXIDINE GLUCONATE 0.12 % MT SOLN
OROMUCOSAL | Status: AC
  Administered 2021-03-04: 15 mL via OROMUCOSAL
  Filled 2021-03-04: qty 15

## 2021-03-04 MED ORDER — PROMETHAZINE HCL 25 MG/ML IJ SOLN: 6.2500 mg | INTRAMUSCULAR | Status: DC | PRN

## 2021-03-04 MED ORDER — PROPOFOL 10 MG/ML IV BOLUS
INTRAVENOUS | Status: AC
  Filled 2021-03-04: qty 20

## 2021-03-04 MED ORDER — HYDROCODONE-ACETAMINOPHEN 5-325 MG PO TABS: 1.0000 | ORAL_TABLET | Freq: Four times a day (QID) | ORAL | 0 refills | Status: DC | PRN

## 2021-03-04 MED ORDER — EPHEDRINE SULFATE 50 MG/ML IJ SOLN
INTRAMUSCULAR | Status: DC | PRN
  Administered 2021-03-04: 10 mg via INTRAVENOUS

## 2021-03-04 MED ORDER — FENTANYL CITRATE (PF) 100 MCG/2ML IJ SOLN
INTRAMUSCULAR | Status: AC
  Filled 2021-03-04: qty 2

## 2021-03-04 MED ORDER — FENTANYL CITRATE (PF) 100 MCG/2ML IJ SOLN
25.0000 ug | INTRAMUSCULAR | Status: DC | PRN
  Administered 2021-03-04 (×2): 50 ug via INTRAVENOUS

## 2021-03-04 MED ORDER — PHENYLEPHRINE HCL (PRESSORS) 10 MG/ML IV SOLN
INTRAVENOUS | Status: AC
  Filled 2021-03-04: qty 1

## 2021-03-04 MED ORDER — CHLORHEXIDINE GLUCONATE 0.12 % MT SOLN: 15.0000 mL | Freq: Once | OROMUCOSAL | Status: AC

## 2021-03-04 MED ORDER — ROCURONIUM BROMIDE 100 MG/10ML IV SOLN
INTRAVENOUS | Status: DC | PRN
  Administered 2021-03-04: 50 mg via INTRAVENOUS

## 2021-03-04 MED ORDER — FAMOTIDINE 20 MG PO TABS: 20.0000 mg | ORAL_TABLET | Freq: Once | ORAL | Status: AC

## 2021-03-04 MED ORDER — SUGAMMADEX SODIUM 200 MG/2ML IV SOLN
INTRAVENOUS | Status: DC | PRN
  Administered 2021-03-04: 200 mg via INTRAVENOUS

## 2021-03-04 MED ORDER — BUPIVACAINE HCL 0.5 % IJ SOLN
INTRAMUSCULAR | Status: DC | PRN
  Administered 2021-03-04: 7 mL

## 2021-03-04 MED ORDER — FAMOTIDINE 20 MG PO TABS
ORAL_TABLET | ORAL | Status: AC
  Administered 2021-03-04: 20 mg via ORAL
  Filled 2021-03-04: qty 1

## 2021-03-04 MED ORDER — MIDAZOLAM HCL 2 MG/2ML IJ SOLN
INTRAMUSCULAR | Status: DC | PRN
  Administered 2021-03-04: 2 mg via INTRAVENOUS

## 2021-03-04 MED ORDER — FENTANYL CITRATE (PF) 100 MCG/2ML IJ SOLN
INTRAMUSCULAR | Status: AC
  Administered 2021-03-04: 50 ug via INTRAVENOUS
  Filled 2021-03-04: qty 2

## 2021-03-04 MED ORDER — OXYCODONE HCL 5 MG/5ML PO SOLN: 5.0000 mg | Freq: Once | ORAL | Status: AC | PRN

## 2021-03-04 MED ORDER — DEXAMETHASONE SODIUM PHOSPHATE 10 MG/ML IJ SOLN
INTRAMUSCULAR | Status: DC | PRN
  Administered 2021-03-04: 10 mg via INTRAVENOUS

## 2021-03-04 MED ORDER — SODIUM CHLORIDE (PF) 0.9 % IJ SOLN
INTRAMUSCULAR | Status: AC
  Filled 2021-03-04: qty 50

## 2021-03-04 MED ORDER — OXYCODONE HCL 5 MG PO TABS
5.0000 mg | ORAL_TABLET | Freq: Once | ORAL | Status: AC | PRN
  Administered 2021-03-04: 5 mg via ORAL

## 2021-03-04 MED ORDER — LIDOCAINE HCL (CARDIAC) PF 100 MG/5ML IV SOSY
PREFILLED_SYRINGE | INTRAVENOUS | Status: DC | PRN
  Administered 2021-03-04: 100 mg via INTRAVENOUS

## 2021-03-04 MED ORDER — ACETAMINOPHEN 10 MG/ML IV SOLN
INTRAVENOUS | Status: AC
  Filled 2021-03-04: qty 100

## 2021-03-04 MED ORDER — BUPIVACAINE HCL (PF) 0.5 % IJ SOLN
INTRAMUSCULAR | Status: AC
  Filled 2021-03-04: qty 30

## 2021-03-04 MED ORDER — IBUPROFEN 800 MG PO TABS: 800.0000 mg | ORAL_TABLET | Freq: Three times a day (TID) | ORAL | 0 refills | Status: DC | PRN

## 2021-03-04 MED ORDER — ORAL CARE MOUTH RINSE: 15.0000 mL | Freq: Once | OROMUCOSAL | Status: AC

## 2021-03-04 SURGICAL SUPPLY — 46 items
ADHESIVE MASTISOL STRL (MISCELLANEOUS) ×3 IMPLANT
BACTOSHIELD CHG 4% 4OZ (MISCELLANEOUS) ×2
BAG URINE DRAIN 2000ML AR STRL (UROLOGICAL SUPPLIES) ×3 IMPLANT
BLADE SURG SZ11 CARB STEEL (BLADE) ×3 IMPLANT
CATH FOLEY 2WAY  5CC 16FR (CATHETERS) ×2
CATH URTH 16FR FL 2W BLN LF (CATHETERS) ×1 IMPLANT
CHLORAPREP W/TINT 26 (MISCELLANEOUS) ×3 IMPLANT
CLIP FILSHIE TUBAL LIGA STRL (Clip) ×2 IMPLANT
CLOSURE WOUND 1/2 X4 (GAUZE/BANDAGES/DRESSINGS) ×1
DEFOGGER SCOPE WARMER CLEARIFY (MISCELLANEOUS) ×3 IMPLANT
ELECT REM PT RETURN 9FT ADLT (ELECTROSURGICAL) ×3
ELECTRODE REM PT RTRN 9FT ADLT (ELECTROSURGICAL) ×1 IMPLANT
GAUZE 4X4 16PLY ~~LOC~~+RFID DBL (SPONGE) ×6 IMPLANT
GLOVE SURG ENC MOIS LTX SZ8 (GLOVE) ×6 IMPLANT
GLOVE SURG POLY ORTHO LF SZ7.5 (GLOVE) ×3 IMPLANT
GOWN STRL REUS W/ TWL LRG LVL3 (GOWN DISPOSABLE) ×1 IMPLANT
GOWN STRL REUS W/ TWL XL LVL3 (GOWN DISPOSABLE) ×2 IMPLANT
GOWN STRL REUS W/TWL LRG LVL3 (GOWN DISPOSABLE) ×2
GOWN STRL REUS W/TWL XL LVL3 (GOWN DISPOSABLE) ×4
HANDLE YANKAUER SUCT BULB TIP (MISCELLANEOUS) ×3 IMPLANT
IRRIGATION STRYKERFLOW (MISCELLANEOUS) IMPLANT
IRRIGATOR STRYKERFLOW (MISCELLANEOUS)
IV LACTATED RINGERS 1000ML (IV SOLUTION) ×3 IMPLANT
KIT PINK PAD W/HEAD ARE REST (MISCELLANEOUS) ×3
KIT PINK PAD W/HEAD ARM REST (MISCELLANEOUS) ×1 IMPLANT
KIT TURNOVER CYSTO (KITS) ×3 IMPLANT
LIGASURE LAP MARYLAND 5MM 37CM (ELECTROSURGICAL) IMPLANT
MANIFOLD NEPTUNE II (INSTRUMENTS) ×3 IMPLANT
PACK BASIN MINOR ARMC (MISCELLANEOUS) ×3 IMPLANT
PACK GYN LAPAROSCOPIC (MISCELLANEOUS) ×3 IMPLANT
PAD OB MATERNITY 4.3X12.25 (PERSONAL CARE ITEMS) ×3 IMPLANT
PAD PREP 24X41 OB/GYN DISP (PERSONAL CARE ITEMS) ×3 IMPLANT
RETRACTOR PHONTONGUIDE ADAPT (ADAPTER) ×3 IMPLANT
SCRUB CHG 4% DYNA-HEX 4OZ (MISCELLANEOUS) ×1 IMPLANT
SLEEVE ENDOPATH XCEL 5M (ENDOMECHANICALS) ×6 IMPLANT
SOLUTION ELECTROLUBE (MISCELLANEOUS) ×3 IMPLANT
SPONGE T-LAP 18X18 ~~LOC~~+RFID (SPONGE) ×3 IMPLANT
STRIP CLOSURE SKIN 1/2X4 (GAUZE/BANDAGES/DRESSINGS) ×2 IMPLANT
SUT VIC AB 0 CT1 27 (SUTURE) ×4
SUT VIC AB 0 CT1 27XCR 8 STRN (SUTURE) ×2 IMPLANT
SUT VIC AB 0 CT1 36 (SUTURE) ×6 IMPLANT
SUT VIC AB 4-0 FS2 27 (SUTURE) ×3 IMPLANT
SYR 10ML LL (SYRINGE) ×3 IMPLANT
TAPE TRANSPORE STRL 2 31045 (GAUZE/BANDAGES/DRESSINGS) ×3 IMPLANT
TROCAR XCEL NON-BLD 5MMX100MML (ENDOMECHANICALS) ×3 IMPLANT
TUBING EVAC SMOKE HEATED PNEUM (TUBING) ×3 IMPLANT
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
## 2021-03-04 NOTE — Interval H&P Note (Signed)
History and Physical Interval Note:  03/04/2021 7:25 AM  Nicole Kramer  has presented today for surgery, with the diagnosis of Desires permanent sterilization.  The various methods of treatment have been discussed with the patient and family. After consideration of risks, benefits and other options for treatment, the patient has consented to  Procedure(s): LAPAROSCOPIC BILATERAL SALPINGECTOMY (Bilateral) as a surgical intervention.  The patient's history has been reviewed, patient examined, no change in status, stable for surgery.  I have reviewed the patient's chart and labs.  Questions were answered to the patient's satisfaction.    We have further discussed this procedure and pt has decided upon Filshie clip sterilization instead.  Brennan Bailey

## 2021-03-04 NOTE — Discharge Instructions (Signed)

## 2021-03-04 NOTE — Anesthesia Postprocedure Evaluation (Signed)
Anesthesia Post Note  Patient: ALEXISS ITURRALDE  Procedure(s) Performed: LAPAROSCOPIC BILATERAL SALPINGECTOMY (Bilateral)  Patient location during evaluation: PACU Anesthesia Type: General Level of consciousness: awake and alert and oriented Pain management: pain level controlled Vital Signs Assessment: post-procedure vital signs reviewed and stable Respiratory status: spontaneous breathing, nonlabored ventilation and respiratory function stable Cardiovascular status: blood pressure returned to baseline and stable Postop Assessment: no signs of nausea or vomiting Anesthetic complications: no   No notable events documented.   Last Vitals:  Vitals:   03/04/21 1000 03/04/21 1013  BP: 120/81 123/71  Pulse: (!) 53 (!) 53  Resp: 16 18  Temp: 36.6 C 36.5 C  SpO2: 95% 97%    Last Pain:  Vitals:   03/04/21 1013  TempSrc: Temporal  PainSc: 3                  Pelagia Iacobucci

## 2021-03-04 NOTE — Transfer of Care (Signed)
Immediate Anesthesia Transfer of Care Note  Patient: Nicole Kramer  Procedure(s) Performed: LAPAROSCOPIC BILATERAL SALPINGECTOMY (Bilateral)  Patient Location: PACU  Anesthesia Type:General  Level of Consciousness: awake, alert  and oriented  Airway & Oxygen Therapy: Patient Spontanous Breathing and Patient connected to face mask oxygen  Post-op Assessment: Report given to RN and Post -op Vital signs reviewed and stable  Post vital signs: Reviewed and stable  Last Vitals:  Vitals Value Taken Time  BP    Temp    Pulse    Resp    SpO2      Last Pain:  Vitals:   03/04/21 0634  TempSrc: Oral  PainSc: 0-No pain         Complications: No notable events documented.

## 2021-03-04 NOTE — Op Note (Signed)
      OPERATIVE NOTE 03/04/2021 9:05 AM  PRE-OPERATIVE DIAGNOSIS:  1) Desires permanent sterilization  POST-OPERATIVE DIAGNOSIS:  1) Same with normal pelvis   OPERATION:  Laparoscopic Filshie clip tubal occlusion for sterilization  SURGEON(S): Surgeon(s) and Role:    Linzie Collin, MD - Primary   ANESTHESIA: Choice  ESTIMATED BLOOD LOSS: 50 mL  OPERATIVE FINDINGS: Nl pelvis  SPECIMEN: * No specimens in log *  COMPLICATIONS: None  DISPOSITION: Stable to recovery room  DESCRIPTION OF PROCEDURE:      The patient was prepped and draped in the dorsolithotomy position and placed under general anesthesia. The bladder was emptied. The cervix was grasped with a multi-toothed tenaculum and a uterine manipulator was placed within the cervical os respecting the position and curvature of the uterus. After changing gloves we proceeded abdominally. A small infraumbilical incision was made and a 11 mm trocar port was placed within the abdominopelvic cavity. The opening pressure was less than 7 mmHg.  Approximately 3 and 1/2 L of carbon dioxide gas was instilled within the abdominal pelvic cavity. The laparoscope was placed and the pelvis and abdomen were carefully inspected. The right fallopian tube was identified and followed out to its fine fimbriated end and then back approximate half the way. At this point the section of tube was elevated on Filshie clip and completely occluded in a perpendicular manner. The left fallopian tube was similarly identified and again the midportion of the tube is completely occluded using a Filshie clip in a perpendicular manner. Hemostasis of the fallopian tubes as well as the pelvis was noted. The laparoscope was removed the trocar sleeve was removed and the incision was closed with a deep suture through the fascia of 0 Vicryl followed by subcuticular closure of the skin. A long-acting anesthetic was injected.  Steri-Strips were applied. The uterine  manipulator was removed. Significant bleeding was noted post-op from the posterior cervix.  2 figure 8 sutures of vicryl were placed with excellent hemostasis of the cervix noted. The patient went to the recovery room in stable condition.   Elonda Husky, M.D. 03/04/2021 9:05 AM

## 2021-03-04 NOTE — Anesthesia Procedure Notes (Signed)
Procedure Name: Intubation Date/Time: 03/04/2021 7:39 AM Performed by: Danelle Berry, CRNA Pre-anesthesia Checklist: Patient identified, Emergency Drugs available, Suction available and Patient being monitored Patient Re-evaluated:Patient Re-evaluated prior to induction Oxygen Delivery Method: Circle system utilized Preoxygenation: Pre-oxygenation with 100% oxygen Induction Type: IV induction Ventilation: Mask ventilation without difficulty Laryngoscope Size: McGraph and 3 Grade View: Grade I Tube type: Oral Tube size: 7.0 mm Number of attempts: 1 Airway Equipment and Method: Stylet and Oral airway Placement Confirmation: ETT inserted through vocal cords under direct vision, positive ETCO2 and breath sounds checked- equal and bilateral Tube secured with: Tape Dental Injury: Teeth and Oropharynx as per pre-operative assessment

## 2021-03-06 ENCOUNTER — Encounter: Payer: Self-pay | Admitting: Obstetrics and Gynecology

## 2021-03-07 LAB — TYPE AND SCREEN
ABO/RH(D): A POS
Antibody Screen: POSITIVE
Extend sample reason: UNDETERMINED
Unit division: 0
Unit division: 0

## 2021-03-07 LAB — BPAM RBC
Blood Product Expiration Date: 202207302359
Blood Product Expiration Date: 202208012359
Unit Type and Rh: 6200
Unit Type and Rh: 6200

## 2021-03-17 ENCOUNTER — Encounter: Payer: Medicaid Other | Admitting: Obstetrics and Gynecology

## 2021-03-18 ENCOUNTER — Encounter: Payer: Self-pay | Admitting: Obstetrics and Gynecology

## 2021-03-22 ENCOUNTER — Encounter: Payer: Self-pay | Admitting: Obstetrics and Gynecology

## 2021-03-22 ENCOUNTER — Other Ambulatory Visit: Payer: Self-pay

## 2021-03-22 ENCOUNTER — Ambulatory Visit (INDEPENDENT_AMBULATORY_CARE_PROVIDER_SITE_OTHER): Payer: Medicaid Other | Admitting: Obstetrics and Gynecology

## 2021-03-22 VITALS — BP 107/74 | HR 89 | Ht 67.0 in | Wt 189.1 lb

## 2021-03-22 DIAGNOSIS — Z9889 Other specified postprocedural states: Secondary | ICD-10-CM
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
## 2021-04-19 ENCOUNTER — Other Ambulatory Visit: Payer: Self-pay | Admitting: Obstetrics and Gynecology

## 2021-04-19 DIAGNOSIS — F53 Postpartum depression: Secondary | ICD-10-CM

## 2021-04-19 DIAGNOSIS — O99345 Other mental disorders complicating the puerperium: Secondary | ICD-10-CM

## 2021-05-24 IMAGING — CT CT RENAL STONE PROTOCOL
3 of 4 series · 8 of 46 positions shown, 15 images · non-contrast
Comparison: September 21, 2016

CLINICAL DATA: Flank pain.

EXAM:
CT ABDOMEN AND PELVIS WITHOUT CONTRAST
TECHNIQUE: Multidetector CT imaging of the abdomen and pelvis was performed
following the standard protocol without IV contrast.

[Series 5: lung bases · axial · 0.70mm/px · z∈[-540,-465]mm · 4 of 26 slices shown, 9 images]
[im 6/26  soft-tissue]
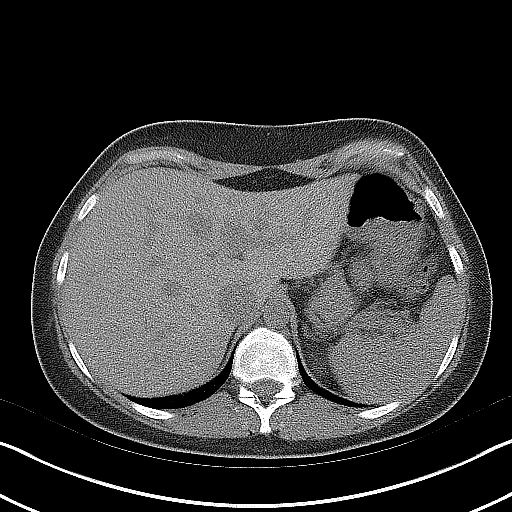
[im 6/26  lung]
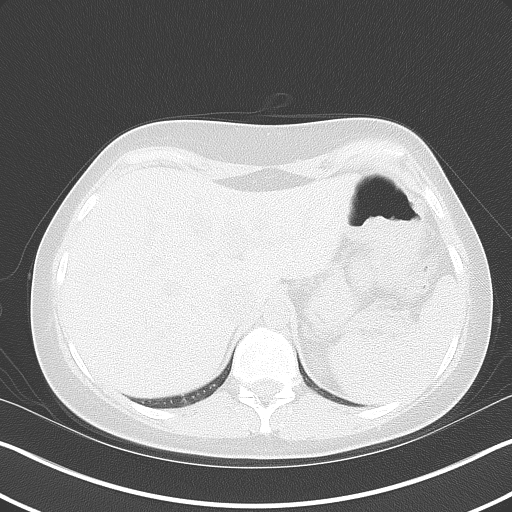
[im 6/26  bone]
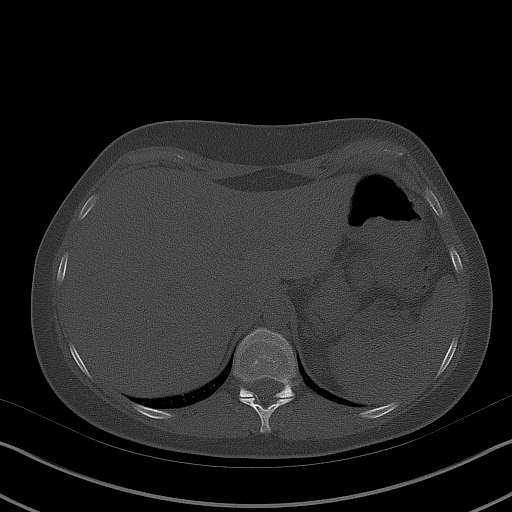
[im 11/26  soft-tissue]
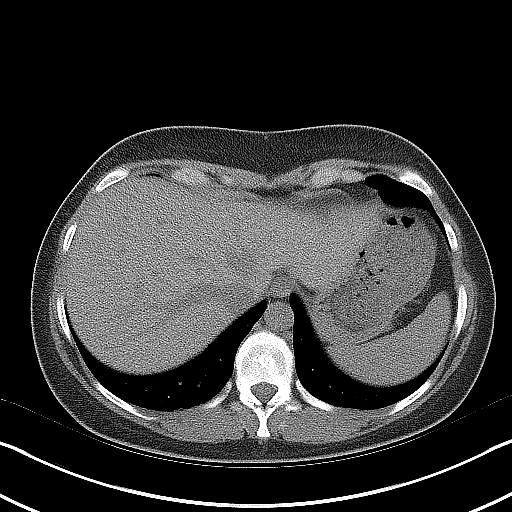
[im 11/26  lung]
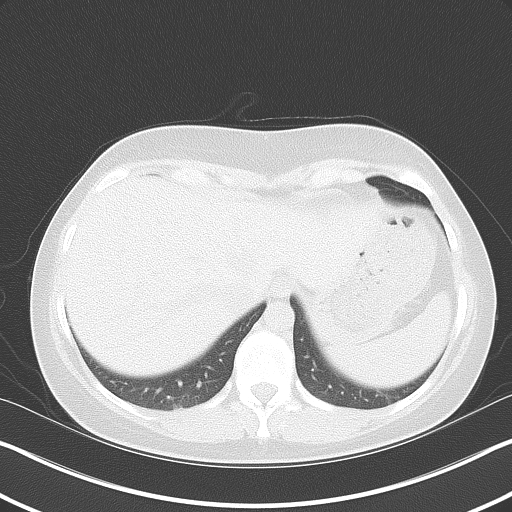
[im 16/26  soft-tissue]
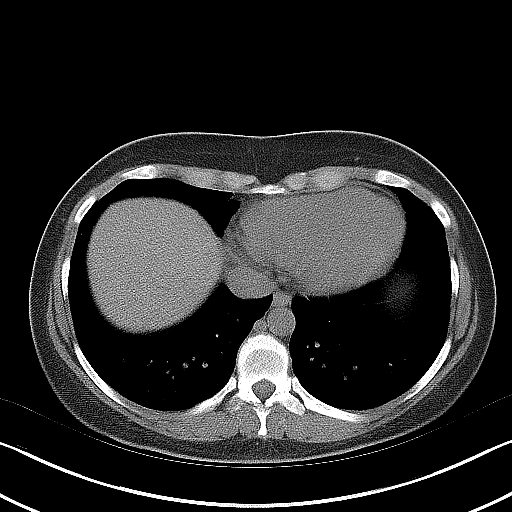
[im 16/26  lung]
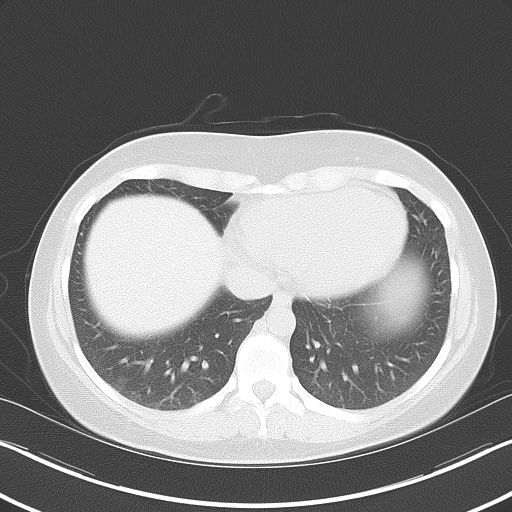
[im 21/26  soft-tissue]
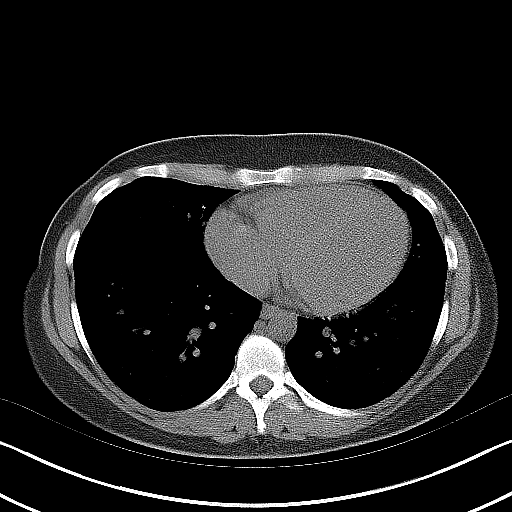
[im 21/26  lung]
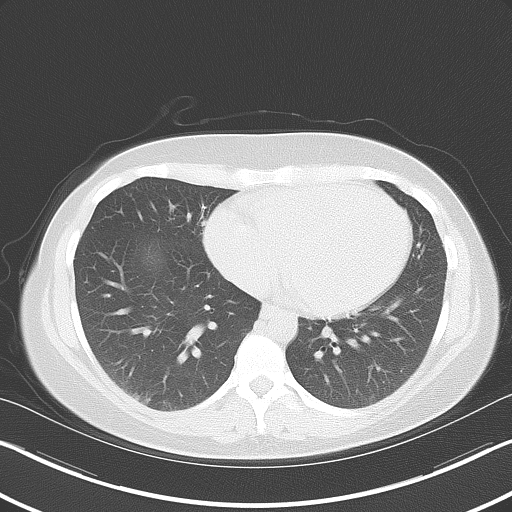

[Series 6: coronal · coronal · 0.69mm/px · 3 of 121 slices shown, 4 images]
[im 41/121  soft-tissue]
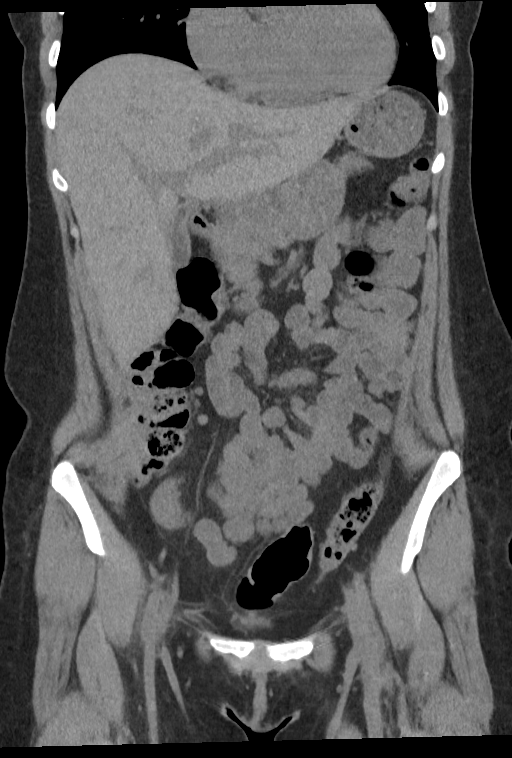
[im 54/121  soft-tissue]
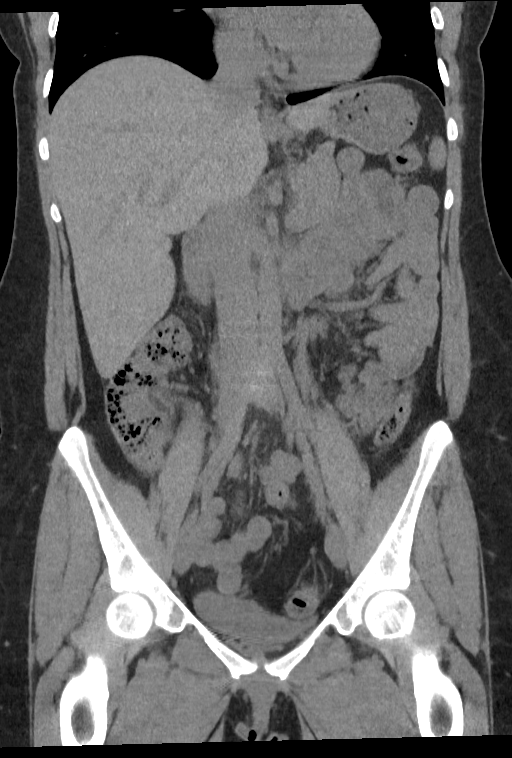
[im 54/121  bone]
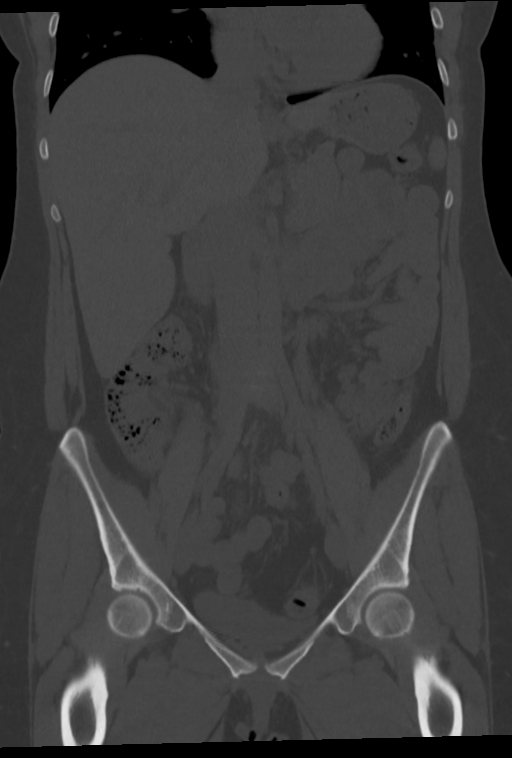
[im 67/121  soft-tissue]
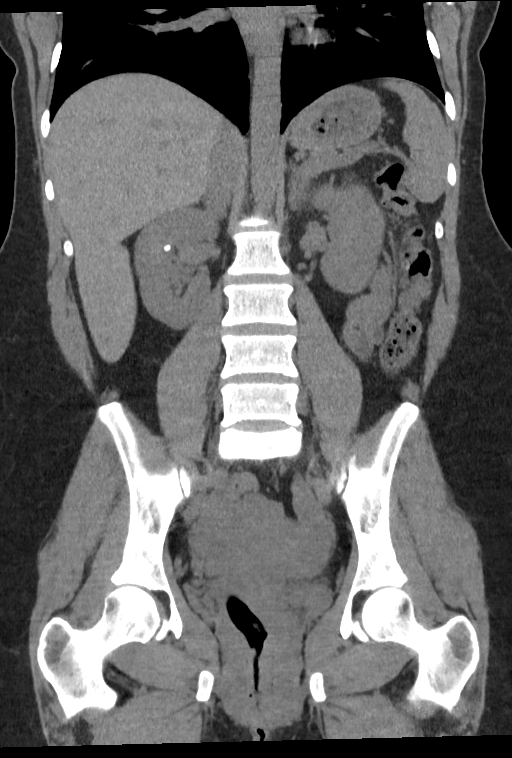

[Series 7: sagittal · sagittal · 0.47mm/px · 1 of 178 slices shown, 2 images]
[im 60/178  soft-tissue]
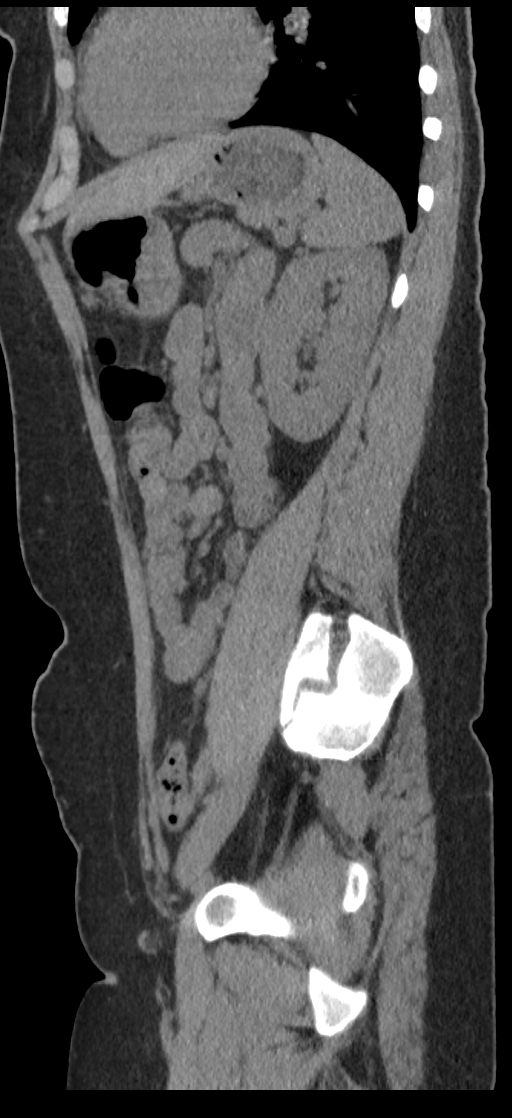
[im 60/178  bone]
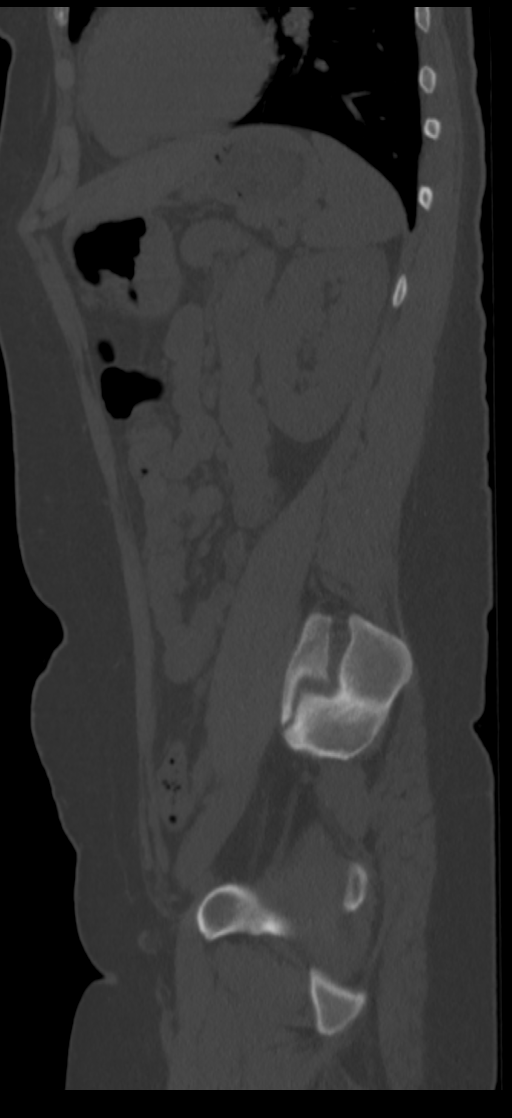

[8 of 46 positions shown; findings below may reference images not displayed]

FINDINGS: Lower chest: The lung bases are clear. The heart size is normal.

Hepatobiliary: The liver is normal. Normal gallbladder.There is no
biliary ductal dilation.

Pancreas: Normal contours without ductal dilatation. No
peripancreatic fluid collection.

Spleen: Unremarkable.

Adrenals/Urinary Tract:

--Adrenal glands: Unremarkable.

--Right kidney/ureter: There is a nonobstructing stone involving the
right kidney measuring approximately 4-5 mm in the interpolar
region. There is no right-sided hydronephrosis. No evidence for a
right ureteral stone.

--Left kidney/ureter: No hydronephrosis or radiopaque kidney stones.

--Urinary bladder: Unremarkable.

Stomach/Bowel:

--Stomach/Duodenum: No hiatal hernia or other gastric abnormality.
Normal duodenal course and caliber.

--Small bowel: Unremarkable.

--Colon: Unremarkable.

--Appendix: Normal.

Vascular/Lymphatic: Normal course and caliber of the major abdominal
vessels.

--No retroperitoneal lymphadenopathy.

--No mesenteric lymphadenopathy.

--No pelvic or inguinal lymphadenopathy.

Reproductive: Unremarkable

Other: No ascites or free air. The abdominal wall is normal.

Musculoskeletal. No acute displaced fractures.
IMPRESSION: 1. Nonobstructive right nephrolithiasis.
2. Normal appendix in the right lower quadrant.

## 2021-10-20 IMAGING — US US OB < 14 WEEKS - US OB TV
1 series · 14 of 28 positions shown · non-contrast
Comparison: None.

CLINICAL DATA: Abdominal cramping

EXAM:
OBSTETRIC <14 WK US AND TRANSVAGINAL OB US
TECHNIQUE: Both transabdominal and transvaginal ultrasound examinations were
performed for complete evaluation of the gestation as well as the
maternal uterus, adnexal regions, and pelvic cul-de-sac.
Transvaginal technique was performed to assess early pregnancy.

[Series 1: us ob less than 14 weeks with ob transvaginal · 14 of 163 slices shown]
[im 7/163]
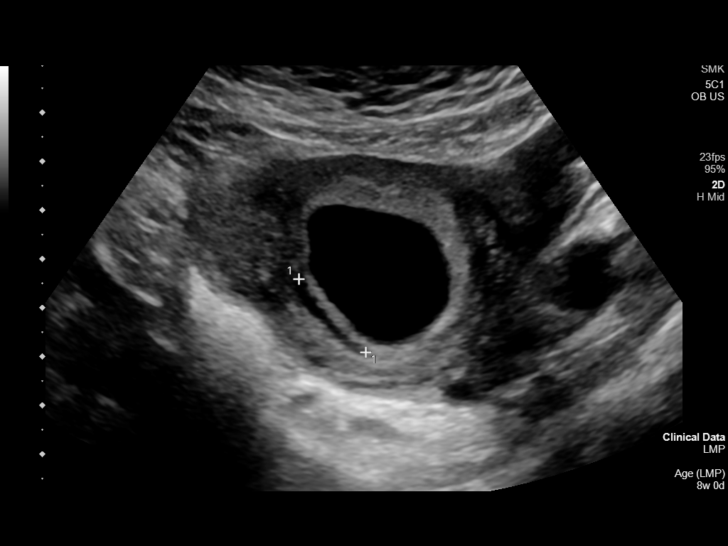
[im 19/163]
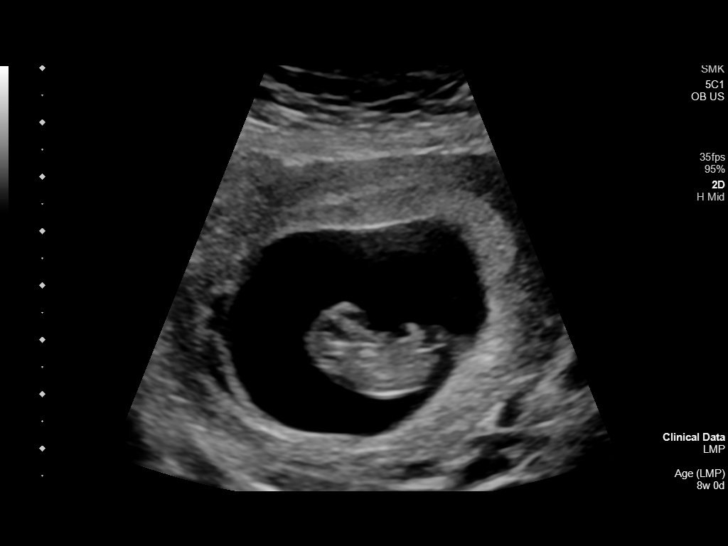
[im 31/163]
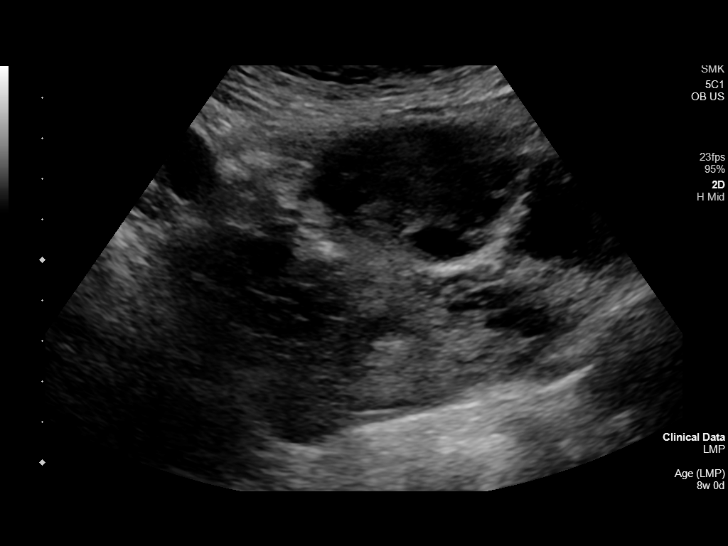
[im 43/163]
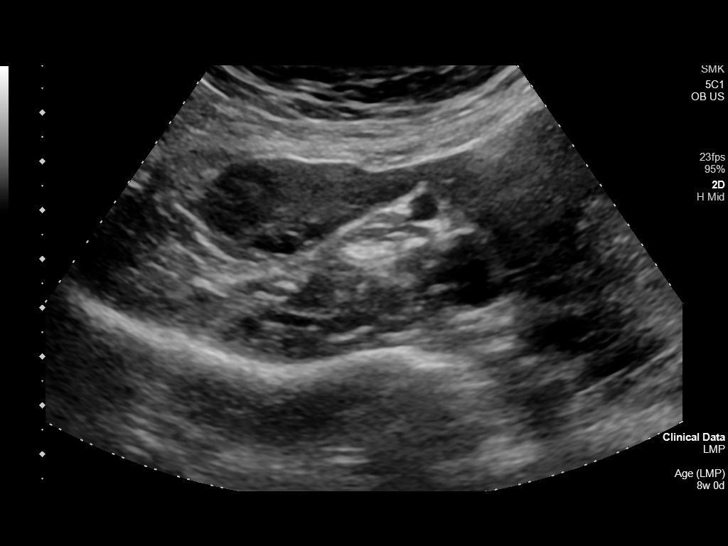
[im 55/163]
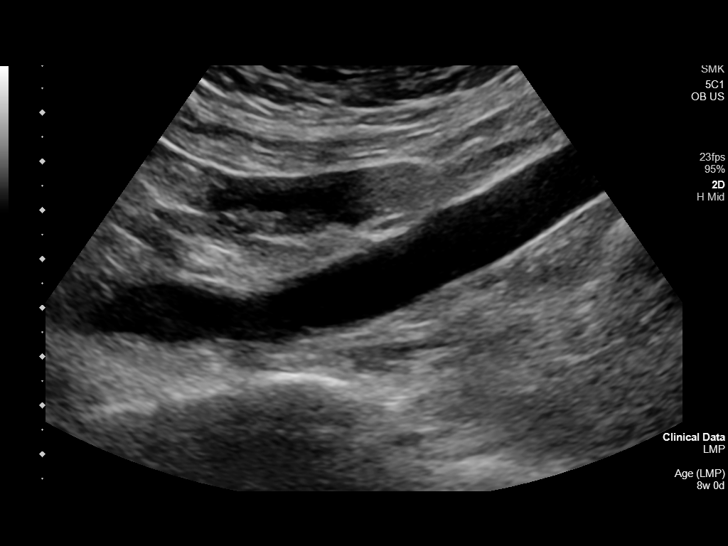
[im 67/163]
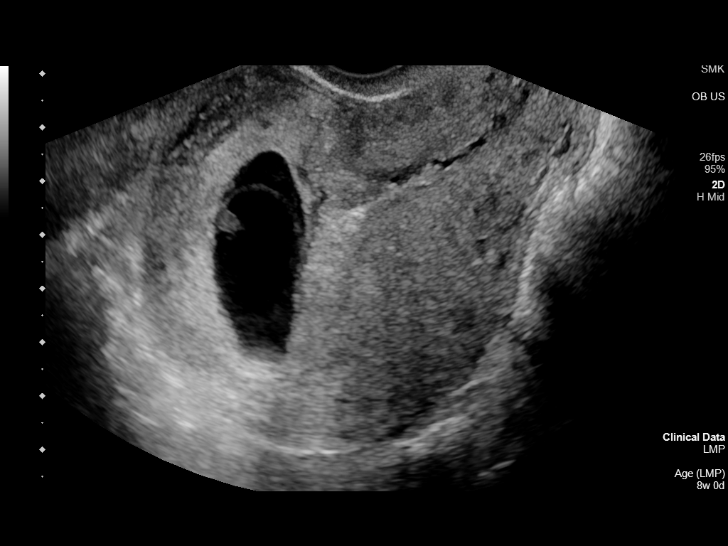
[im 79/163]
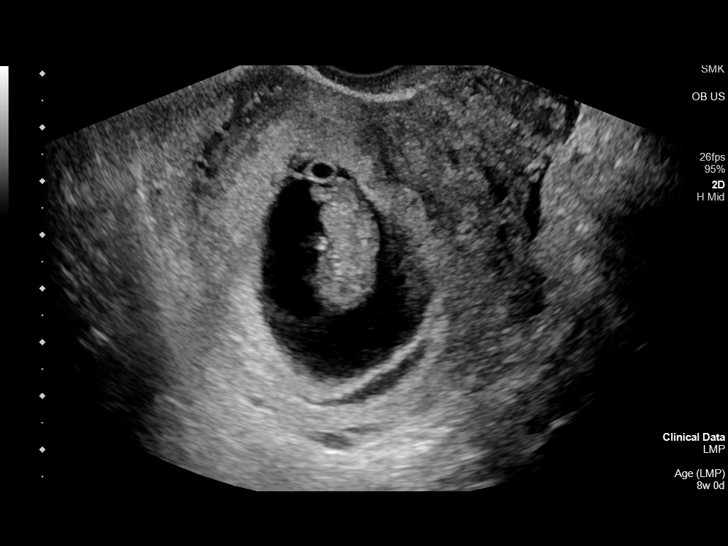
[im 91/163]
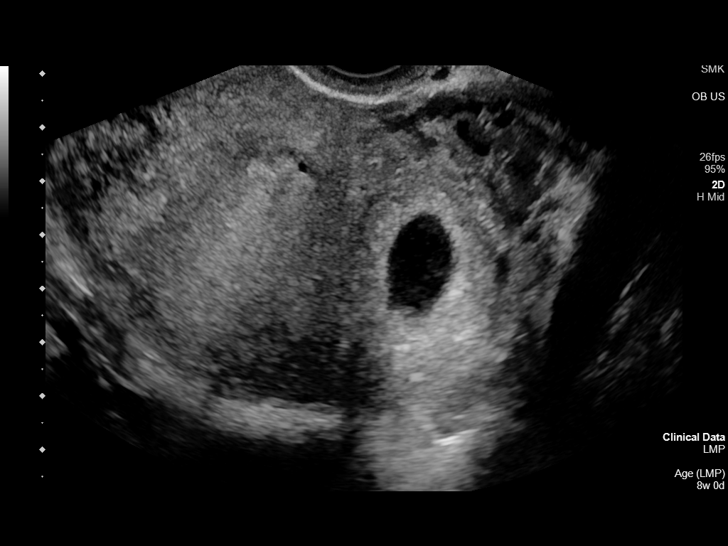
[im 103/163]
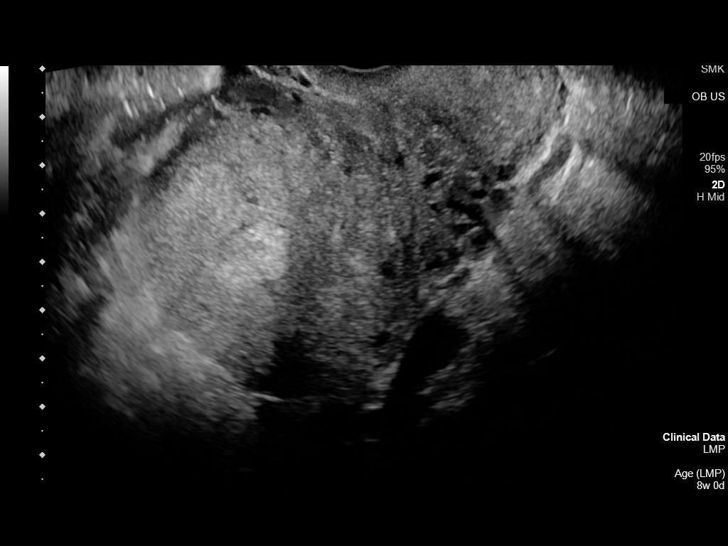
[im 115/163]
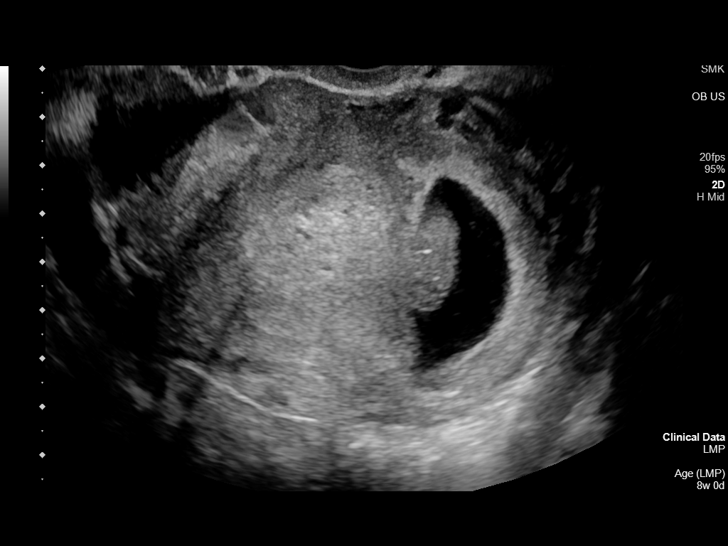
[im 127/163]
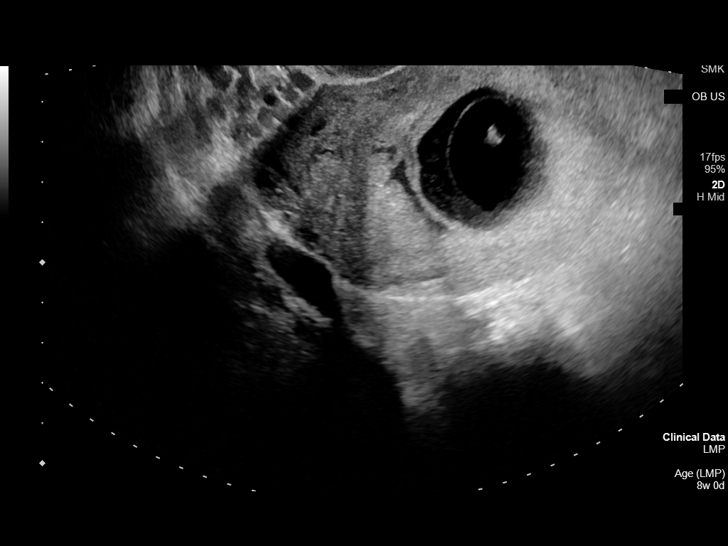
[im 139/163]
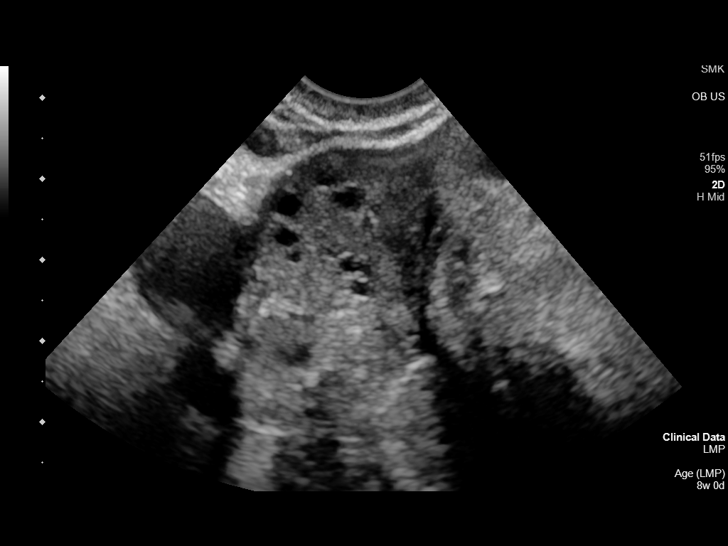
[im 151/163]
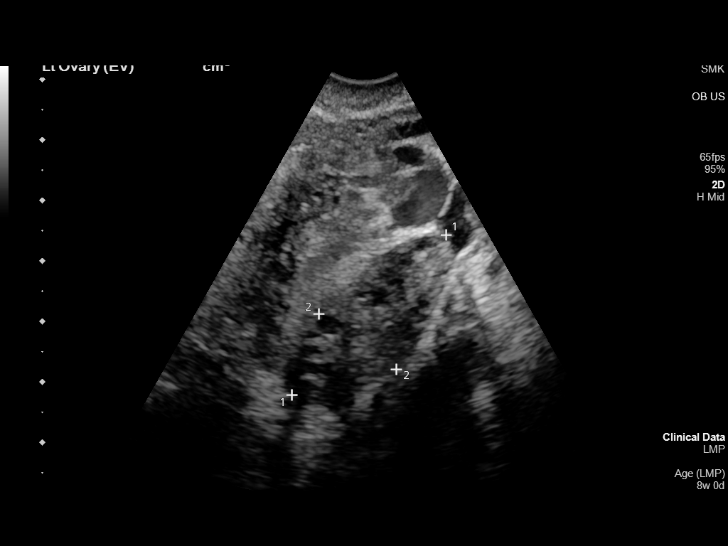
[im 163/163]
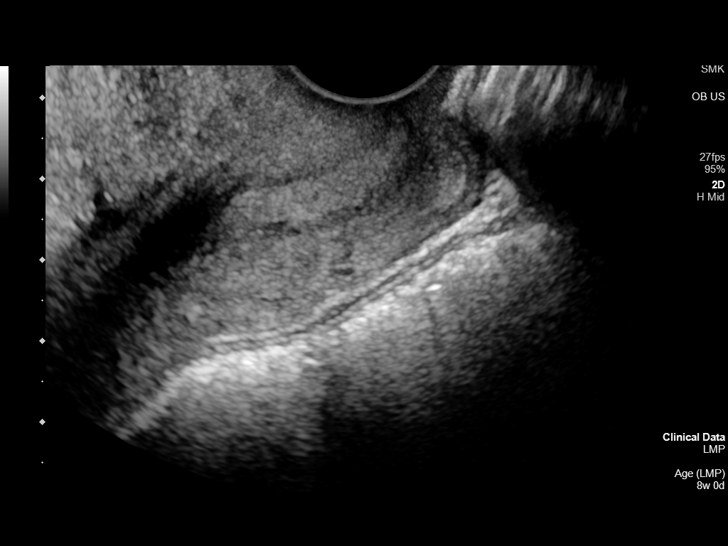

[14 of 28 positions shown; findings below may reference images not displayed]

FINDINGS: Intrauterine gestational sac: Present, single

Yolk sac:  Present, single, normal-appearing

Embryo:  Present, single

Cardiac Activity: Present, regular

Heart Rate: 176 bpm

MSD: Appropriate given fetal size

CRL:  26 mm   9 w   3 d                  US EDC: 01/23/2021

Subchorionic hemorrhage: A small subchorionic hemorrhage is present.

Maternal uterus/adnexae: No intrauterine masses. The cervix is
closed and is unremarkable. No significant free fluid within the
pelvis. The maternal ovaries are normal in size and echogenicity.
IMPRESSION: Single living intrauterine gestation. Estimated gestational age 9
weeks, 3 days.

Small subchorionic hemorrhage.

## 2022-06-15 ENCOUNTER — Encounter: Payer: Self-pay | Admitting: Obstetrics and Gynecology

## 2023-11-27 DIAGNOSIS — Z87448 Personal history of other diseases of urinary system: Secondary | ICD-10-CM | POA: Insufficient documentation

## 2023-11-27 DIAGNOSIS — Z719 Counseling, unspecified: Secondary | ICD-10-CM | POA: Insufficient documentation

## 2023-11-27 DIAGNOSIS — F39 Unspecified mood [affective] disorder: Secondary | ICD-10-CM | POA: Insufficient documentation

## 2023-11-27 DIAGNOSIS — G4700 Insomnia, unspecified: Secondary | ICD-10-CM | POA: Insufficient documentation

## 2023-11-27 DIAGNOSIS — Z0000 Encounter for general adult medical examination without abnormal findings: Secondary | ICD-10-CM | POA: Insufficient documentation

## 2023-12-12 ENCOUNTER — Encounter: Payer: Self-pay | Admitting: Nurse Practitioner

## 2024-01-08 DIAGNOSIS — N3090 Cystitis, unspecified without hematuria: Secondary | ICD-10-CM | POA: Insufficient documentation

## 2024-04-10 DIAGNOSIS — F902 Attention-deficit hyperactivity disorder, combined type: Secondary | ICD-10-CM | POA: Insufficient documentation
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
## 2024-06-13 ENCOUNTER — Ambulatory Visit (INDEPENDENT_AMBULATORY_CARE_PROVIDER_SITE_OTHER): Admitting: Family Medicine

## 2024-06-13 ENCOUNTER — Encounter: Payer: Self-pay | Admitting: Family Medicine

## 2024-06-13 VITALS — BP 124/52 | HR 72 | Temp 98.3°F | Ht 67.0 in | Wt 153.0 lb

## 2024-06-13 DIAGNOSIS — R197 Diarrhea, unspecified: Secondary | ICD-10-CM

## 2024-06-13 DIAGNOSIS — Z8719 Personal history of other diseases of the digestive system: Secondary | ICD-10-CM | POA: Diagnosis not present

## 2024-06-13 DIAGNOSIS — R1084 Generalized abdominal pain: Secondary | ICD-10-CM

## 2024-06-13 DIAGNOSIS — R109 Unspecified abdominal pain: Secondary | ICD-10-CM | POA: Diagnosis not present

## 2024-06-13 DIAGNOSIS — K625 Hemorrhage of anus and rectum: Secondary | ICD-10-CM | POA: Diagnosis not present

## 2024-06-13 MED ORDER — NA SULFATE-K SULFATE-MG SULF 17.5-3.13-1.6 GM/177ML PO SOLN: 1.0000 | Freq: Once | ORAL | 0 refills | Status: AC

## 2024-06-16 ENCOUNTER — Other Ambulatory Visit: Payer: Self-pay

## 2024-06-18 ENCOUNTER — Encounter: Payer: Self-pay | Admitting: Gastroenterology

## 2024-06-19 ENCOUNTER — Ambulatory Visit
Admission: RE | Admit: 2024-06-19 | Discharge: 2024-06-19 | Disposition: A | Discharge status: Home / Self Care | Attending: Gastroenterology | Admitting: Gastroenterology

## 2024-06-19 ENCOUNTER — Encounter: Payer: Self-pay | Admitting: Gastroenterology

## 2024-06-19 ENCOUNTER — Other Ambulatory Visit: Payer: Self-pay

## 2024-06-19 ENCOUNTER — Encounter: Admission: RE | Disposition: A | Payer: Self-pay | Source: Home / Self Care | Attending: Gastroenterology

## 2024-06-19 ENCOUNTER — Ambulatory Visit: Admitting: Registered Nurse

## 2024-06-19 DIAGNOSIS — D123 Benign neoplasm of transverse colon: Secondary | ICD-10-CM | POA: Insufficient documentation

## 2024-06-19 DIAGNOSIS — F419 Anxiety disorder, unspecified: Secondary | ICD-10-CM | POA: Diagnosis not present

## 2024-06-19 DIAGNOSIS — F17210 Nicotine dependence, cigarettes, uncomplicated: Secondary | ICD-10-CM | POA: Diagnosis not present

## 2024-06-19 DIAGNOSIS — K625 Hemorrhage of anus and rectum: Secondary | ICD-10-CM

## 2024-06-19 DIAGNOSIS — K640 First degree hemorrhoids: Secondary | ICD-10-CM | POA: Diagnosis not present

## 2024-06-19 DIAGNOSIS — R634 Abnormal weight loss: Secondary | ICD-10-CM | POA: Insufficient documentation

## 2024-06-19 DIAGNOSIS — K529 Noninfective gastroenteritis and colitis, unspecified: Secondary | ICD-10-CM | POA: Diagnosis not present

## 2024-06-19 DIAGNOSIS — R197 Diarrhea, unspecified: Secondary | ICD-10-CM

## 2024-06-19 HISTORY — DX: Irritable bowel syndrome, unspecified: K58.9

## 2024-06-19 HISTORY — PX: POLYPECTOMY: SHX149

## 2024-06-19 HISTORY — PX: COLONOSCOPY: SHX5424

## 2024-06-19 LAB — POCT PREGNANCY, URINE: Preg Test, Ur: NEGATIVE

## 2024-06-19 SURGERY — COLONOSCOPY
Anesthesia: General

## 2024-06-19 MED ORDER — PROPOFOL 1000 MG/100ML IV EMUL
INTRAVENOUS | Status: AC
  Filled 2024-06-19: qty 100

## 2024-06-19 MED ORDER — SODIUM CHLORIDE 0.9 % IV SOLN: INTRAVENOUS | Status: DC

## 2024-06-19 MED ORDER — PROPOFOL 10 MG/ML IV BOLUS
INTRAVENOUS | Status: DC | PRN
  Administered 2024-06-19: 80 mg via INTRAVENOUS

## 2024-06-19 MED ORDER — PROPOFOL 10 MG/ML IV BOLUS
INTRAVENOUS | Status: AC
  Filled 2024-06-19: qty 20

## 2024-06-19 MED ORDER — LIDOCAINE HCL (CARDIAC) PF 100 MG/5ML IV SOSY
PREFILLED_SYRINGE | INTRAVENOUS | Status: DC | PRN
  Administered 2024-06-19: 60 mg via INTRAVENOUS

## 2024-06-19 MED ORDER — PROPOFOL 500 MG/50ML IV EMUL
INTRAVENOUS | Status: DC | PRN
  Administered 2024-06-19: 200 ug/kg/min via INTRAVENOUS

## 2024-06-19 MED ORDER — DEXMEDETOMIDINE HCL IN NACL 80 MCG/20ML IV SOLN
INTRAVENOUS | Status: DC | PRN
  Administered 2024-06-19: 8 ug via INTRAVENOUS

## 2024-06-19 NOTE — Op Note (Signed)
 [facility] Gastroenterology Patient Name: Nicole Kramer Procedure Date: 06/19/2024 7:57 AM MRN: [id number] Account #: [id number] Date of Birth: 1996-08-14 Admit Type: Outpatient Age: 28 Room: [facility] ENDO ROOM 4 Gender: Female Note Status: Finalized Instrument Name: Colon Scope [id number] Procedure:             Colonoscopy Indications:           Chronic diarrhea, Weight loss Providers:             [name] MD, MD Medicines:             Propofol  per Anesthesia Complications:         No immediate complications. Procedure:             Pre-Anesthesia Assessment:                        - Prior to the procedure, a History and Physical was                         performed, and patient medications and allergies were                         reviewed. The patient's tolerance of previous                         anesthesia was also reviewed. The risks and benefits                         of the procedure and the sedation options and risks                         were discussed with the patient. All questions were                         answered, and informed consent was obtained. Prior                         Anticoagulants: The patient has taken no anticoagulant                         or antiplatelet agents. ASA Grade Assessment: II - A                         patient with mild systemic disease. After reviewing                         the risks and benefits, the patient was deemed in                         satisfactory condition to undergo the procedure.                        After obtaining informed consent, the colonoscope was                         passed under direct vision. Throughout the procedure,                         the patient's blood  pressure, pulse, and oxygen                         saturations were monitored continuously. The                         Colonoscope was introduced through the anus and                         advanced to the the terminal  ileum. The colonoscopy                         was performed without difficulty. The patient                         tolerated the procedure well. The quality of the bowel                         preparation was excellent. Findings:      The perianal and digital rectal examinations were normal.      A localized area of mucosa in the terminal ileum was mildly       erythematous. Biopsies were taken with a cold forceps for histology.      A 3 mm polyp was found in the transverse colon. The polyp was sessile.       The polyp was removed with a cold snare. Resection and retrieval were       complete.      The colon (entire examined portion) appeared normal. Random biopsies       were obtained in the entire colon with cold forceps for histology.      Non-bleeding internal hemorrhoids were found during retroflexion. The       hemorrhoids were Grade I (internal hemorrhoids that do not prolapse). Impression:            - Erythematous mucosa in the terminal ileum. Biopsied.                        - One 3 mm polyp in the transverse colon, removed with                         a cold snare. Resected and retrieved.                        - The entire examined colon is normal.                        - Non-bleeding internal hemorrhoids.                        - Random biopsies were obtained in the entire colon. Recommendation:        - Discharge patient to home.                        - Resume previous diet.                        - Continue present medications.                        -  Await pathology results. Procedure Code(s):     --- Professional ---                        [phone], Colonoscopy, flexible; with removal of                         tumor(s), polyp(s), or other lesion(s) by snare                         technique                        45380, 59, Colonoscopy, flexible; with biopsy, single                         or multiple Diagnosis Code(s):     --- Professional ---                         K52.9, Noninfective gastroenteritis and colitis,                         unspecified                        R63.4, Abnormal weight loss                        D12.3, Benign neoplasm of transverse colon (hepatic                         flexure or splenic flexure) CPT copyright 2022 American [employer] Association. All rights reserved. The codes documented in this report are preliminary and upon coder review may  be revised to meet current compliance requirements. [name] MD, MD 06/19/2024 8:19:26 AM This report has been signed electronically. Number of Addenda: 0 Note Initiated On: 06/19/2024 7:57 AM Scope Withdrawal Time: 0 hours 6 minutes 20 seconds  Total Procedure Duration: 0 hours 11 minutes 57 seconds  Estimated Blood Loss:  Estimated blood loss: none.      [facility]
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
## 2024-06-19 NOTE — Transfer of Care (Signed)
 Immediate Anesthesia Transfer of Care Note  Patient: Nicole Kramer  Procedure(s) Performed: COLONOSCOPY POLYPECTOMY, INTESTINE  Patient Location: PACU  Anesthesia Type:General  Level of Consciousness: drowsy and patient cooperative  Airway & Oxygen Therapy: Patient Spontanous Breathing  Post-op Assessment: Report given to RN and Post -op Vital signs reviewed and stable  Post vital signs: stable  Last Vitals:  Vitals Value Taken Time  BP    Temp    Pulse    Resp 16 06/19/24 08:21  SpO2    Vitals shown include unfiled device data.  Last Pain:  Vitals:   06/19/24 0751  TempSrc: Tympanic         Complications: No notable events documented.

## 2024-06-19 NOTE — Anesthesia Postprocedure Evaluation (Signed)
 Anesthesia Post Note  Patient: Nicole Kramer  Procedure(s) Performed: COLONOSCOPY POLYPECTOMY, INTESTINE  Patient location during evaluation: PACU Anesthesia Type: General Level of consciousness: awake Pain management: pain level controlled Vital Signs Assessment: post-procedure vital signs reviewed and stable Respiratory status: spontaneous breathing Cardiovascular status: stable Anesthetic complications: no   No notable events documented.   Last Vitals:  Vitals:   06/19/24 0831 06/19/24 0841  BP: 101/71 98/75  Pulse: 72   Resp: (!) 22   Temp:    SpO2: 100% 100%    Last Pain:  Vitals:   06/19/24 0841  TempSrc:   PainSc: 0-No pain                 VAN STAVEREN,Trigger Frasier
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
## 2024-06-20 LAB — SURGICAL PATHOLOGY

## 2024-06-24 ENCOUNTER — Encounter: Payer: Self-pay | Admitting: Family Medicine

## 2024-06-25 DIAGNOSIS — J3489 Other specified disorders of nose and nasal sinuses: Secondary | ICD-10-CM | POA: Insufficient documentation

## 2024-06-25 DIAGNOSIS — B029 Zoster without complications: Secondary | ICD-10-CM | POA: Insufficient documentation

## 2024-06-30 ENCOUNTER — Telehealth: Payer: Self-pay

## 2024-06-30 NOTE — Telephone Encounter (Signed)
 Spoke with patient today and she will have labs done today.

## 2024-07-10 ENCOUNTER — Telehealth: Payer: Self-pay

## 2024-07-10 ENCOUNTER — Other Ambulatory Visit: Payer: Self-pay

## 2024-07-10 NOTE — Telephone Encounter (Signed)
 Requesting pathology results, please advise

## 2024-07-14 NOTE — Telephone Encounter (Signed)
 Pt is aware that she needs to complete the additional labs... f/u appt r/s as she has not given stool sample or had labs drawn

## 2024-07-16 ENCOUNTER — Ambulatory Visit: Admitting: Family Medicine

## 2024-07-29 ENCOUNTER — Other Ambulatory Visit: Payer: Self-pay

## 2024-07-29 DIAGNOSIS — Z9151 Personal history of suicidal behavior: Secondary | ICD-10-CM | POA: Insufficient documentation

## 2024-08-04 ENCOUNTER — Ambulatory Visit: Admitting: Family Medicine

## 2024-08-20 ENCOUNTER — Ambulatory Visit: Admitting: Obstetrics and Gynecology

## 2024-08-20 ENCOUNTER — Encounter: Payer: Self-pay | Admitting: Obstetrics and Gynecology

## 2024-08-20 VITALS — BP 116/76 | HR 87 | Wt 151.0 lb

## 2024-08-20 DIAGNOSIS — R102 Pelvic and perineal pain unspecified side: Secondary | ICD-10-CM | POA: Diagnosis not present

## 2024-08-20 DIAGNOSIS — R112 Nausea with vomiting, unspecified: Secondary | ICD-10-CM | POA: Diagnosis not present

## 2024-08-20 NOTE — Progress Notes (Signed)
°  GYNECOLOGY PROGRESS NOTE  History:  Ms. ELEN ACERO is a 28 y.o. [id number] presents to [facility] office today for problem gyn visit. She reports she had an U/S through [facility] October and was told she had multiple ovarian cysts and she needed to follow-up with GYN. She states that the pelvic pain  She denies h/a, dizziness, shortness of breath, n/v, or fever/chills.    The following portions of the patient's history were reviewed and updated as appropriate: allergies, current medications, past family history, past medical history, past social history, past surgical history and problem list. Last pap smear on 06/20/2018 was normal.  Review of Systems:  Pertinent items are noted in HPI.   Objective:  Physical Exam Blood pressure 116/76, pulse 87, weight 151 lb (68.5 kg). VS reviewed, nursing note reviewed,  Constitutional: well developed, well nourished, no distress HEENT: normocephalic CV: normal rate Pulm/chest wall: normal effort Breast Exam: deferred Abdomen: soft, mild tenderness on RT and LT lower with deep palpation Neuro: alert and oriented x 3 Skin: warm, dry Psych: affect normal Pelvic exam: deferred  Assessment & Plan:  1. Pelvic pain (Primary) - ROI signed and submitted to [facility] via Epic -- reviewed U/S results from 05/2024 - Advised that multiple follicles seen on the U/S done at [facility] could be indicative of PCOS - Explained that the CLC seen on ovary is normal occurrence after menses and it will be reabsorbed by the body, if pregnancy is not achieved. - F/U with GI for upper abdominal pain and N/V/D  2. Nausea and vomiting, unspecified vomiting type - F/U with GI on 1/131/2026 as scheduled  Total face-to-face time spent during this encounter was 20 minutes. There was 5 minutes of chart review time spent prior to this encounter. Total time spent = 25 minutes.    Raiquan Chandler, CNM 8:36 AM

## 2024-08-20 NOTE — Progress Notes (Signed)
 CC: GI issues- seeing GI provider, running labs  Pt stating that while she was at a hospital visit for her GI issues they did a scan and found ovarian cysts and is requesting intervention as she is having pain

## 2024-08-21 ENCOUNTER — Encounter: Payer: Self-pay | Admitting: *Deleted
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
## 2024-09-16 ENCOUNTER — Telehealth: Payer: Self-pay

## 2024-09-16 ENCOUNTER — Encounter: Payer: Self-pay | Admitting: Family Medicine

## 2024-09-16 ENCOUNTER — Ambulatory Visit: Admitting: Family Medicine

## 2024-09-16 VITALS — BP 110/74 | HR 84 | Temp 98.4°F | Ht 67.0 in | Wt 144.6 lb

## 2024-09-16 DIAGNOSIS — K5909 Other constipation: Secondary | ICD-10-CM

## 2024-09-16 DIAGNOSIS — K644 Residual hemorrhoidal skin tags: Secondary | ICD-10-CM

## 2024-09-16 DIAGNOSIS — R1013 Epigastric pain: Secondary | ICD-10-CM

## 2024-09-16 DIAGNOSIS — K648 Other hemorrhoids: Secondary | ICD-10-CM

## 2024-09-16 DIAGNOSIS — K5900 Constipation, unspecified: Secondary | ICD-10-CM | POA: Diagnosis not present

## 2024-09-16 MED ORDER — HYDROCORTISONE ACETATE 25 MG RE SUPP: 25.0000 mg | Freq: Two times a day (BID) | RECTAL | 0 refills | Status: AC

## 2024-09-16 MED ORDER — HYDROCORTISONE (PERIANAL) 2.5 % EX CREA: 1.0000 | TOPICAL_CREAM | Freq: Two times a day (BID) | CUTANEOUS | 1 refills | Status: AC

## 2024-09-16 NOTE — Patient Instructions (Signed)
" ° °  FODMAP stands for fermentable oligo-, di-, mono-saccharides and polyols. These are the scientific terms used to classify groups of carbs that are difficult for our body to digest and that are notorious for triggering digestive symptoms like bloating, gas, loose stools and stomach pain.   Please follow the low FODMAP diet for the next few weeks, and see how you feel. After a few weeks, you can start adding back in the high FODMAP items and see what triggers your symptoms.  - the table at the very bottom contains foods that are low in FODMAPs    "

## 2024-09-16 NOTE — Telephone Encounter (Signed)
 Spoke with Meridith at [facility] testing- she checked and there is no sign indicating labs were done.

## 2024-09-20 LAB — CELIAC DISEASE AB SCREEN W/RFX
Deamidated Gliadin Abs, IgA: 4 U (ref 0–19)
Immunoglobulin A, (IgA) QN, Serum: 228 mg/dL (ref 87–352)
t-Transglutaminase (tTG) IgA: <2 U/mL (ref 0–3)

## 2024-09-21 ENCOUNTER — Ambulatory Visit: Payer: Self-pay | Admitting: Family Medicine

## 2024-11-18 ENCOUNTER — Ambulatory Visit: Admitting: Gastroenterology
